# Patient Record
Sex: Female | Born: 1995 | Race: Black or African American | Hispanic: No | Marital: Single | Smoking: Never smoker
Health system: Southern US, Community
[De-identification: ages and names within clinical notes are randomized; demographics above are authoritative.]

## PROBLEM LIST (undated history)

## (undated) DIAGNOSIS — Q825 Congenital non-neoplastic nevus: Secondary | ICD-10-CM

## (undated) DIAGNOSIS — J45909 Unspecified asthma, uncomplicated: Secondary | ICD-10-CM

## (undated) DIAGNOSIS — D649 Anemia, unspecified: Secondary | ICD-10-CM

## (undated) DIAGNOSIS — Q828 Other specified congenital malformations of skin: Secondary | ICD-10-CM

## (undated) DIAGNOSIS — T7840XA Allergy, unspecified, initial encounter: Secondary | ICD-10-CM

## (undated) DIAGNOSIS — L709 Acne, unspecified: Secondary | ICD-10-CM

## (undated) HISTORY — DX: Unspecified asthma, uncomplicated: J45.909

## (undated) HISTORY — DX: Congenital non-neoplastic nevus: Q82.5

## (undated) HISTORY — DX: Anemia, unspecified: D64.9

## (undated) HISTORY — DX: Acne, unspecified: L70.9

## (undated) HISTORY — PX: TYMPANOSTOMY TUBE PLACEMENT: SHX32

## (undated) HISTORY — DX: Other specified congenital malformations of skin: Q82.8

## (undated) HISTORY — DX: Allergy, unspecified, initial encounter: T78.40XA

---
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
## 2015-04-23 ENCOUNTER — Encounter: Payer: Self-pay | Admitting: *Deleted

## 2015-04-23 ENCOUNTER — Inpatient Hospital Stay
Admission: EM | Admit: 2015-04-23 | Discharge: 2015-04-25 | DRG: 872 | Disposition: A | Discharge status: Home / Self Care | Payer: Managed Care, Other (non HMO) | Attending: Internal Medicine | Admitting: Internal Medicine

## 2015-04-23 DIAGNOSIS — E871 Hypo-osmolality and hyponatremia: Secondary | ICD-10-CM | POA: Diagnosis present

## 2015-04-23 DIAGNOSIS — Z79891 Long term (current) use of opiate analgesic: Secondary | ICD-10-CM

## 2015-04-23 DIAGNOSIS — A4151 Sepsis due to Escherichia coli [E. coli]: Principal | ICD-10-CM | POA: Diagnosis present

## 2015-04-23 DIAGNOSIS — N10 Acute tubulo-interstitial nephritis: Secondary | ICD-10-CM | POA: Diagnosis present

## 2015-04-23 DIAGNOSIS — R51 Headache: Secondary | ICD-10-CM | POA: Diagnosis present

## 2015-04-23 DIAGNOSIS — N12 Tubulo-interstitial nephritis, not specified as acute or chronic: Secondary | ICD-10-CM | POA: Diagnosis present

## 2015-04-23 DIAGNOSIS — Z880 Allergy status to penicillin: Secondary | ICD-10-CM | POA: Diagnosis not present

## 2015-04-23 DIAGNOSIS — J45909 Unspecified asthma, uncomplicated: Secondary | ICD-10-CM | POA: Diagnosis present

## 2015-04-23 DIAGNOSIS — Z79899 Other long term (current) drug therapy: Secondary | ICD-10-CM

## 2015-04-23 DIAGNOSIS — A419 Sepsis, unspecified organism: Secondary | ICD-10-CM | POA: Diagnosis present

## 2015-04-23 DIAGNOSIS — A770 Spotted fever due to Rickettsia rickettsii: Secondary | ICD-10-CM | POA: Diagnosis present

## 2015-04-23 LAB — CBC WITH DIFFERENTIAL/PLATELET
Band Neutrophils: 0 %
Basophils Absolute: 0 10*3/uL (ref 0–0.1)
Basophils Relative: 0 %
Blasts: 0 %
Eosinophils Absolute: 0 10*3/uL (ref 0–0.7)
Eosinophils Relative: 0 %
HCT: 40.7 % (ref 35.0–47.0)
Hemoglobin: 13.3 g/dL (ref 12.0–16.0)
Lymphocytes Relative: 13 %
Lymphs Abs: 2.3 10*3/uL (ref 1.0–3.6)
MCH: 27.4 pg (ref 26.0–34.0)
MCHC: 32.7 g/dL (ref 32.0–36.0)
MCV: 83.9 fL (ref 80.0–100.0)
Metamyelocytes Relative: 0 %
Monocytes Absolute: 1.2 10*3/uL — ABNORMAL HIGH (ref 0.2–0.9)
Monocytes Relative: 7 %
Myelocytes: 0 %
Neutro Abs: 14.2 10*3/uL — ABNORMAL HIGH (ref 1.4–6.5)
Neutrophils Relative %: 80 %
Other: 0 %
Platelets: 257 10*3/uL (ref 150–440)
Promyelocytes Absolute: 0 %
RBC: 4.84 MIL/uL (ref 3.80–5.20)
RDW: 15.2 % — ABNORMAL HIGH (ref 11.5–14.5)
WBC: 17.7 10*3/uL — ABNORMAL HIGH (ref 3.6–11.0)
nRBC: 0 /100 WBC

## 2015-04-23 LAB — COMPREHENSIVE METABOLIC PANEL
ALT: 17 U/L (ref 14–54)
AST: 22 U/L (ref 15–41)
Albumin: 4.2 g/dL (ref 3.5–5.0)
Alkaline Phosphatase: 46 U/L (ref 38–126)
Anion gap: 8 (ref 5–15)
BUN: 7 mg/dL (ref 6–20)
CO2: 24 mmol/L (ref 22–32)
Calcium: 9.4 mg/dL (ref 8.9–10.3)
Chloride: 102 mmol/L (ref 101–111)
Creatinine, Ser: 0.84 mg/dL (ref 0.44–1.00)
GFR calc Af Amer: >60 mL/min (ref >60)
GFR calc non Af Amer: >60 mL/min (ref >60)
Glucose, Bld: 104 mg/dL — ABNORMAL HIGH (ref 65–99)
Potassium: 3.5 mmol/L (ref 3.5–5.1)
Sodium: 134 mmol/L — ABNORMAL LOW (ref 135–145)
Total Bilirubin: 1 mg/dL (ref 0.3–1.2)
Total Protein: 8.5 g/dL — ABNORMAL HIGH (ref 6.5–8.1)

## 2015-04-23 LAB — PROTIME-INR
INR: 1.15
PROTHROMBIN TIME: 14.9 s (ref 11.4–15.0)

## 2015-04-23 LAB — URINALYSIS COMPLETE WITH MICROSCOPIC (ARMC ONLY)
Bilirubin Urine: NEGATIVE
Glucose, UA: NEGATIVE mg/dL
Ketones, ur: NEGATIVE mg/dL
Nitrite: NEGATIVE
Protein, ur: 30 mg/dL — AB
Specific Gravity, Urine: 1.015 (ref 1.005–1.030)
pH: 5 (ref 5.0–8.0)

## 2015-04-23 LAB — PREGNANCY, URINE: Preg Test, Ur: NEGATIVE

## 2015-04-23 MED ORDER — SODIUM CHLORIDE 0.9 % IV SOLN
INTRAVENOUS | Status: DC
Start: 2015-04-23 — End: 2015-04-25
  Administered 2015-04-24 – 2015-04-25 (×4): via INTRAVENOUS

## 2015-04-23 MED ORDER — ONDANSETRON HCL 4 MG PO TABS: 4.0000 mg | ORAL_TABLET | Freq: Four times a day (QID) | ORAL | Status: DC | PRN

## 2015-04-23 MED ORDER — ONDANSETRON HCL 4 MG/2ML IJ SOLN: 4.0000 mg | Freq: Four times a day (QID) | INTRAMUSCULAR | Status: DC | PRN

## 2015-04-23 MED ORDER — CIPROFLOXACIN IN D5W 400 MG/200ML IV SOLN
400.0000 mg | Freq: Two times a day (BID) | INTRAVENOUS | Status: DC
  Administered 2015-04-24: 400 mg via INTRAVENOUS
  Filled 2015-04-23 (×5): qty 200

## 2015-04-23 MED ORDER — CEFTRIAXONE SODIUM 2 G IJ SOLR
2.0000 g | Freq: Once | INTRAMUSCULAR | Status: AC
  Administered 2015-04-24: 2 g via INTRAVENOUS
  Filled 2015-04-23 (×2): qty 2

## 2015-04-23 MED ORDER — IBUPROFEN 600 MG PO TABS
600.0000 mg | ORAL_TABLET | ORAL | Status: AC
Start: 2015-04-23 — End: 2015-04-23
  Administered 2015-04-23: 600 mg via ORAL
  Filled 2015-04-23: qty 1

## 2015-04-23 MED ORDER — ACETAMINOPHEN 325 MG PO TABS
650.0000 mg | ORAL_TABLET | Freq: Four times a day (QID) | ORAL | Status: DC | PRN
  Administered 2015-04-23 – 2015-04-24 (×2): 650 mg via ORAL
  Filled 2015-04-23 (×2): qty 2

## 2015-04-23 MED ORDER — ACETAMINOPHEN 500 MG PO TABS
1000.0000 mg | ORAL_TABLET | ORAL | Status: AC
  Administered 2015-04-23: 1000 mg via ORAL
  Filled 2015-04-23: qty 2

## 2015-04-23 MED ORDER — HEPARIN SODIUM (PORCINE) 5000 UNIT/ML IJ SOLN
5000.0000 [IU] | Freq: Three times a day (TID) | INTRAMUSCULAR | Status: DC
  Administered 2015-04-23 – 2015-04-24 (×2): 5000 [IU] via SUBCUTANEOUS
  Filled 2015-04-23 (×2): qty 1

## 2015-04-23 MED ORDER — ALBUTEROL SULFATE (2.5 MG/3ML) 0.083% IN NEBU: 2.5000 mg | INHALATION_SOLUTION | RESPIRATORY_TRACT | Status: DC | PRN

## 2015-04-23 MED ORDER — ACETAMINOPHEN 650 MG RE SUPP: 650.0000 mg | Freq: Four times a day (QID) | RECTAL | Status: DC | PRN

## 2015-04-23 MED ORDER — SODIUM CHLORIDE 0.9 % IV BOLUS (SEPSIS)
1000.0000 mL | Freq: Once | INTRAVENOUS | Status: AC
  Administered 2015-04-23: 1000 mL via INTRAVENOUS

## 2015-04-23 NOTE — ED Provider Notes (Signed)
[facility] Emergency Department Provider Note REMINDER - THIS NOTE IS NOT A FINAL MEDICAL RECORD UNTIL IT IS SIGNED. UNTIL THEN, THE CONTENT BELOW MAY REFLECT INFORMATION FROM A DOCUMENTATION TEMPLATE, NOT THE ACTUAL PATIENT VISIT. ____________________________________________  Time seen: Approximately 6:30 PM  I have reviewed the triage vital signs and the nursing notes.   HISTORY  Chief Complaint Headache    HPI Laura Fleming is a 19 y.o. female conservative evaluation of chills and headaches. Patient reports that she's been having intermittent symptoms of a pressure headache over the frontal scalp off-and-on since March and it comes and goes about once a month and last for a few days. Usually makes her feel achy around her back, neck, and makes light hurt her eyes. She does report she has chills, but does not know of any fevers. She denies any travel or tick bites. She denies any other signs or symptoms of illness. She denies pregnancy.  She does report she has not been eating very well today because whenever she eats it seems to make her symptoms of nausea slightly worse.  She does have a history of asthma She is now be her inhaler which she is occasionally but is not having any symptoms today. History reviewed. No pertinent past medical history.  Patient Active Problem List   Diagnosis Date Noted  . Pyelonephritis 04/23/2015  . Sepsis 04/23/2015    History reviewed. No pertinent past surgical history.  Current Outpatient Rx  Name  Route  Sig  Dispense  Refill  . albuterol (PROVENTIL HFA;VENTOLIN HFA) 108 (90 BASE) MCG/ACT inhaler   Inhalation   Inhale 2 puffs into the lungs every 6 (six) hours as needed for wheezing or shortness of breath.         Marland Kitchen ibuprofen (ADVIL,MOTRIN) 200 MG tablet   Oral   Take 400 mg by mouth every 6 (six) hours as needed for headache.           Allergies Penicillins  History reviewed. No pertinent family  history.  Social History Social History  Substance Use Topics  . Smoking status: Never Smoker   . Smokeless tobacco: None  . Alcohol Use: No    Review of Systems Constitutional: Chills  Eyes: No visual changes. Light does make the headache slightly worse. ENT: No sore throat. Cardiovascular: Denies chest pain. Respiratory: Denies shortness of breath. Gastrointestinal: No abdominal pain.  No nausea, no vomiting.  No diarrhea.  No constipation. Does report having an achy pain in her right lower back for the last few days as well. Genitourinary: Negative for dysuria. Musculoskeletal: Muscle aches. Skin: Negative for rash. No tick or insect bite. Neurological: Negative for headaches, focal weakness or numbness.  She denies outdoor exposure camping or other risk factors for being bit by tick.  10-point ROS otherwise negative.  ____________________________________________   PHYSICAL EXAM:  VITAL SIGNS: ED Triage Vitals  Enc Vitals Group     BP 04/23/15 1755 132/76 mmHg     Pulse Rate 04/23/15 1755 131     Resp 04/23/15 1755 18     Temp 04/23/15 1755 100.2 F (37.9 C)     Temp Source 04/23/15 1755 Oral     SpO2 04/23/15 1755 100 %     Weight 04/23/15 1755 153 lb (69.4 kg)     Height 04/23/15 1755  (1.651 m)     Head Cir --      Peak Flow --      Pain  Score 04/23/15 1758 8     Pain Loc --      Pain Edu? --      Excl. in GC? --    Constitutional: Alert and oriented. Well appearing and in no acute distress. Eyes: Conjunctivae are normal. PERRL. EOMI. Head: Atraumatic. Nose: No congestion/rhinnorhea. Mouth/Throat: Mucous membranes are moist.  Oropharynx non-erythematous. Neck: No stridor.  The patient has no nuchal rigidity. She does report that it is somewhat achy when she turns her head left and right in the area of her neck. Cardiovascular: Normal rate, regular rhythm. Grossly normal heart sounds.  Good peripheral circulation. Respiratory: Normal respiratory  effort.  No retractions. Lungs CTAB. Gastrointestinal: Soft and nontender. No distention. No abdominal bruits. Patient exhibits some mild CVA tenderness on the right. Musculoskeletal: No lower extremity tenderness nor edema.  No joint effusions. Neurologic:  Normal speech and language. No gross focal neurologic deficits are appreciated. No gait instability. Skin:  Skin is warm, dry and intact. No rash noted. Psychiatric: Mood and affect are normal. Speech and behavior are normal.  ____________________________________________   LABS (all labs ordered are listed, but only abnormal results are displayed)  Labs Reviewed  CBC WITH DIFFERENTIAL/PLATELET - Abnormal; Notable for the following:    WBC 17.7 (*)    RDW 15.2 (*)    All other components within normal limits  COMPREHENSIVE METABOLIC PANEL - Abnormal; Notable for the following:    Sodium 134 (*)    Glucose, Bld 104 (*)    Total Protein 8.5 (*)    All other components within normal limits  URINALYSIS COMPLETEWITH MICROSCOPIC ([facility] ONLY) - Abnormal; Notable for the following:    Color, Urine YELLOW (*)    APPearance HAZY (*)    Hgb urine dipstick 2+ (*)    Protein, ur 30 (*)    Leukocytes, UA 1+ (*)    Bacteria, UA MANY (*)    Squamous Epithelial / LPF 0-5 (*)    All other components within normal limits  PREGNANCY, URINE  PROTIME-INR  ROCKY MTN SPOTTED FVR ABS PNL(IGG+IGM)   ____________________________________________  EKG   ____________________________________________  RADIOLOGY   ____________________________________________   PROCEDURES  Procedure(s) performed: None  Critical Care performed: No  ____________________________________________   INITIAL IMPRESSION / ASSESSMENT AND PLAN / ED COURSE  Pertinent labs & imaging results that were available during my care of the patient were reviewed by me and considered in my medical decision making (see chart for details).  Patient presents with mild headache  feeling of chills and some slight photosensitivity and discomfort along with myalgias since Monday. It seems unlikely at this point that the patient would have acute meningitis based on her minimum symptomatology, but I wonder if she may have a mild other viral-type illness or possibly urinary tract infection based on reported right-sided CVA tenderness. She is notably tachycardic and does have a slight low-grade temperature. I discussed with the patient, I feel that if we evaluated with labs and urinalysis and no other infection is obvious as a cause, then we would consider performing lumbar puncture to rule out meningitis. She has no focal neurologic symptoms, no indication for CT of the head.  ----------------------------------------- 7:55 PM on 04/23/2015 -----------------------------------------  Patient reports some improvement in her symptoms with hydration, she is still slightly tachycardic. She has obvious evidence of urinary tract infection which would be consistent with right-sided pyelonephritis based on examination and urinalysis. Patient is presently meeting sepsis criteria. We will add blood cultures and initiate  Rocephin, the patient reports she had a penicillin allergy as a child but is able to tolerate medications including Augmentin in the past which is seemingly makes true penicillin allergy and ceftriaxone cross-reactivity extremely unlikely.  The patient agreeable with admission, antibiotic's and discussed with Dr. Imogene Burn the hospitalist. ____________________________________________   FINAL CLINICAL IMPRESSION(S) / ED DIAGNOSES  Final diagnoses:  Pyelonephritis  Sepsis, due to unspecified organism      Sharyn Creamer, MD 04/23/15 2000

## 2015-04-23 NOTE — ED Notes (Addendum)
Patient states that since March or May she has had intermittent headaches with chills. Patient states symptoms started again yesterday morning when she went to work. Patient c/o photosensitivity.

## 2015-04-23 NOTE — H&P (Signed)
[facility] [place] at [facility]   PATIENT NAME: Laura Fleming    MR#:  [id number]  DATE OF BIRTH:  [birth year]  DATE OF ADMISSION:  04/23/2015  PRIMARY CARE PHYSICIAN: Ruel Favors, MD   REQUESTING/REFERRING PHYSICIAN: Sharyn Creamer, MD  CHIEF COMPLAINT:   Chief Complaint  Patient presents with  . Headache   fever, chills and headache today.  HISTORY OF PRESENT ILLNESS:  Laura Fleming  is a 19 y.o. female with no past medical history. The patient presents to the ED with fever, chills and headache today. She denies any cough, sputum or shortness of breath. She denies any dysuria, hematuria or incontinence or urinary frequency. She complained of right flank pain. Urinalysis showed too numerous WBC.  PAST MEDICAL HISTORY:  History reviewed. No pertinent past medical history. no past medical history.  PAST SURGICAL HISTORY:  History reviewed. No pertinent past surgical history. no surgical history.  SOCIAL HISTORY:   Social History  Substance Use Topics  . Smoking status: Never Smoker   . Smokeless tobacco: Not on file  . Alcohol Use: No   Sexually active with protection, no history of sexually transmitted disease. FAMILY HISTORY:  History reviewed. No pertinent family history.No family history. Both parents are healthy.  DRUG ALLERGIES:   Allergies  Allergen Reactions  . Penicillins Other (See Comments)    Reaction:  Unknown; childhood reaction     REVIEW OF SYSTEMS:  CONSTITUTIONAL:Has  Fever,chill and headache.but has generalized weakness. No appetite.  EYES: No blurred or double vision.  EARS, NOSE, AND THROAT: No tinnitus or ear pain.  RESPIRATORY: No cough, shortness of breath, wheezing or hemoptysis.  CARDIOVASCULAR: No chest pain, orthopnea, edema.  GASTROINTESTINAL: No nausea, vomiting, diarrhea or abdominal pain. But has a right-sided flank pain.  GENITOURINARY: No dysuria, hematuria. No urine frequency.  ENDOCRINE: No  polyuria, nocturia,  HEMATOLOGY: No anemia, easy bruising or bleeding SKIN: No rash or lesion. MUSCULOSKELETAL: No joint pain or arthritis.   NEUROLOGIC: No tingling, numbness, weakness.  PSYCHIATRY: No anxiety or depression.   MEDICATIONS AT HOME:   Prior to Admission medications   Medication Sig Start Date End Date Taking? Authorizing Provider  albuterol (PROVENTIL HFA;VENTOLIN HFA) 108 (90 BASE) MCG/ACT inhaler Inhale 2 puffs into the lungs every 6 (six) hours as needed for wheezing or shortness of breath.   Yes Historical Provider, MD  ibuprofen (ADVIL,MOTRIN) 200 MG tablet Take 400 mg by mouth every 6 (six) hours as needed for headache.   Yes Historical Provider, MD      VITAL SIGNS:  Blood pressure 132/86, pulse 114, temperature 100.2 F (37.9 C), temperature source Oral, resp. rate 16, height  (1.651 m), weight 69.4 kg (153 lb), SpO2 99 %.  PHYSICAL EXAMINATION:  GENERAL:  19 y.o.-year-old patient lying in the bed with no acute distress.  EYES: Pupils equal, round, reactive to light and accommodation. No scleral icterus. Extraocular muscles intact.  HEENT: Head atraumatic, normocephalic. Oropharynx and nasopharynx clear. Moist oral mucosa.  NECK:  Supple, no jugular venous distention. No thyroid enlargement, no tenderness.  LUNGS: Normal breath sounds bilaterally, no wheezing, rales,rhonchi or crepitation. No use of accessory muscles of respiration.  CARDIOVASCULAR: S1, S2 normal. No murmurs, rubs, or gallops.  ABDOMEN: Soft, nontender, nondistended. Bowel sounds present. No organomegaly or mass. CVA tenderness on the right side.  EXTREMITIES: No pedal edema, cyanosis, or clubbing.  NEUROLOGIC: Cranial nerves II through XII are intact. Muscle strength 5/5 in all extremities. Sensation  intact. Gait not checked.  PSYCHIATRIC: The patient is alert and oriented x 3.  SKIN: No obvious rash, lesion, or ulcer.   LABORATORY PANEL:   CBC  Recent Labs Lab 04/23/15 1818  WBC  17.7*  HGB 13.3  HCT 40.7  PLT 257   ------------------------------------------------------------------------------------------------------------------  Chemistries   Recent Labs Lab 04/23/15 1818  NA 134*  K 3.5  CL 102  CO2 24  GLUCOSE 104*  BUN 7  CREATININE 0.84  CALCIUM 9.4  AST 22  ALT 17  ALKPHOS 46  BILITOT 1.0   ------------------------------------------------------------------------------------------------------------------  Cardiac Enzymes No results for input(s): TROPONINI in the last 168 hours. ------------------------------------------------------------------------------------------------------------------  RADIOLOGY:  No results found.  EKG:  No orders found for this or any previous visit.  IMPRESSION AND PLAN:   Acute pyelonephritis Sepsis with fever, leukocytosis and tachycardia Hyponatremia  The patient will be admitted to medical floor. I will start Cipro IV twice a day, follow-up CBC, blood culture and urine culture. I will start normal saline IV and follow-up BMP. Negative pregnant test.   All the records are reviewed and case discussed with ED provider. Management plans discussed with the patient, family and they are in agreement.  CODE STATUS: Full code  TOTAL TIME TAKING CARE OF THIS PATIENT: 46 minutes.    Shaune Pollack M.D on 04/23/2015 at 8:12 PM  Between 7am to 6pm - Pager - [phone]  After 6pm go to [email or web address] - password [email or web address] [facility]  [employer] [place] [facility]  Office  [phone]  CC: Primary care physician; Ruel Favors, MD

## 2015-04-23 NOTE — ED Notes (Signed)
MD at bedside. 

## 2015-04-24 LAB — CULTURE, BLOOD (ROUTINE X 2): Special Requests: NORMAL

## 2015-04-24 LAB — CBC
HCT: 34.6 % — ABNORMAL LOW (ref 35.0–47.0)
Hemoglobin: 11.6 g/dL — ABNORMAL LOW (ref 12.0–16.0)
MCH: 28.1 pg (ref 26.0–34.0)
MCHC: 33.5 g/dL (ref 32.0–36.0)
MCV: 83.9 fL (ref 80.0–100.0)
PLATELETS: 200 10*3/uL (ref 150–440)
RBC: 4.12 MIL/uL (ref 3.80–5.20)
RDW: 15 % — AB (ref 11.5–14.5)
WBC: 9.3 10*3/uL (ref 3.6–11.0)

## 2015-04-24 LAB — BASIC METABOLIC PANEL
Anion gap: 4 — ABNORMAL LOW (ref 5–15)
BUN: 9 mg/dL (ref 6–20)
CALCIUM: 8 mg/dL — AB (ref 8.9–10.3)
CO2: 25 mmol/L (ref 22–32)
CREATININE: 0.77 mg/dL (ref 0.44–1.00)
Chloride: 109 mmol/L (ref 101–111)
GFR calc Af Amer: >60 mL/min (ref >60)
GLUCOSE: 102 mg/dL — AB (ref 65–99)
Potassium: 3.7 mmol/L (ref 3.5–5.1)
Sodium: 138 mmol/L (ref 135–145)

## 2015-04-24 LAB — ROCKY MTN SPOTTED FVR ABS PNL(IGG+IGM)
RMSF IGG: NEGATIVE
RMSF IGM: 2.47 {index} — AB (ref 0.00–0.89)

## 2015-04-24 MED ORDER — DOXYCYCLINE HYCLATE 100 MG PO TABS
100.0000 mg | ORAL_TABLET | Freq: Two times a day (BID) | ORAL | Status: DC
  Administered 2015-04-24 – 2015-04-25 (×2): 100 mg via ORAL
  Filled 2015-04-24 (×3): qty 1

## 2015-04-24 MED ORDER — OXYCODONE HCL 5 MG PO TABS
5.0000 mg | ORAL_TABLET | ORAL | Status: DC | PRN
  Administered 2015-04-25: 5 mg via ORAL
  Filled 2015-04-24: qty 1

## 2015-04-24 MED ORDER — DEXTROSE 5 % IV SOLN
1.0000 g | INTRAVENOUS | Status: DC
  Administered 2015-04-24: 13:00:00 1 g via INTRAVENOUS
  Filled 2015-04-24 (×3): qty 10

## 2015-04-24 MED ORDER — MORPHINE SULFATE (PF) 2 MG/ML IV SOLN: 2.0000 mg | INTRAVENOUS | Status: DC | PRN

## 2015-04-24 NOTE — Plan of Care (Signed)
Problem: Discharge Progression Outcomes Goal: Discharge plan in place and appropriate Individualization of care  Outcome: Progressing Individualization Likes to be called Verl. Lives at home. Has history of asthma, controlled by medication.    Goal: Other Discharge Outcomes/Goals Outcome: Progressing Progress toward goals: Pain:  Pt c/o burning pain when urinating. Pain decreases to acceptable post void. Hemodynamically stable. No complications noted. Tolerating diet Independent in ambulation. Discharge tomorrow if no changes overnight.

## 2015-04-24 NOTE — Progress Notes (Signed)
[facility] - [place] at [facility]   PATIENT NAME: Laura Fleming    MR#:  [id number]  DATE OF BIRTH:  [birth year]  SUBJECTIVE: Patient admitted for pyelonephritis. She states she has some mom left flank pain and dysuria.   CHIEF COMPLAINT:   Chief Complaint  Patient presents with  . Headache    REVIEW OF SYSTEMS:   ROS CONSTITUTIONAL: No fever, fatigue or weakness.  EYES: No blurred or double vision.  EARS, NOSE, AND THROAT: No tinnitus or ear pain.  RESPIRATORY: No cough, shortness of breath, wheezing or hemoptysis.  CARDIOVASCULAR: No chest pain, orthopnea, edema.  GASTROINTESTINAL: No nausea, vomiting, diarrhea or abdominal pain.  GENITOURINARY: No dysuria, hematuria.  ENDOCRINE: No polyuria, nocturia,  HEMATOLOGY: No anemia, easy bruising or bleeding SKIN: No rash or lesion. MUSCULOSKELETAL: No joint pain or arthritis.   NEUROLOGIC: No tingling, numbness, weakness.  PSYCHIATRY: No anxiety or depression.   DRUG ALLERGIES:   Allergies  Allergen Reactions  . Penicillins Other (See Comments)    Reaction:  Unknown; childhood reaction     VITALS:  Blood pressure 113/66, pulse 70, temperature 97.6 F (36.4 C), temperature source Oral, resp. rate 22, height  (1.651 m), weight 69.673 kg (153 lb 9.6 oz), SpO2 100 %.  PHYSICAL EXAMINATION:  GENERAL:  19 y.o.-year-old patient lying in the bed with no acute distress.  EYES: Pupils equal, round, reactive to light and accommodation. No scleral icterus. Extraocular muscles intact.  HEENT: Head atraumatic, normocephalic. Oropharynx and nasopharynx clear.  NECK:  Supple, no jugular venous distention. No thyroid enlargement, no tenderness.  LUNGS: Normal breath sounds bilaterally, no wheezing, rales,rhonchi or crepitation. No use of accessory muscles of respiration.  CARDIOVASCULAR: S1, S2 normal. No murmurs, rubs, or gallops.  ABDOMEN: Soft, nontender, nondistended. Bowel sounds present. No  organomegaly or mass.  EXTREMITIES: No pedal edema, cyanosis, or clubbing.  NEUROLOGIC: Cranial nerves II through XII are intact. Muscle strength 5/5 in all extremities. Sensation intact. Gait not checked.  PSYCHIATRIC: The patient is alert and oriented x 3.  SKIN: No obvious rash, lesion, or ulcer.    LABORATORY PANEL:   CBC  Recent Labs Lab 04/24/15 0546  WBC 9.3  HGB 11.6*  HCT 34.6*  PLT 200   ------------------------------------------------------------------------------------------------------------------  Chemistries   Recent Labs Lab 04/23/15 1818 04/24/15 0546  NA 134* 138  K 3.5 3.7  CL 102 109  CO2 24 25  GLUCOSE 104* 102*  BUN 7 9  CREATININE 0.84 0.77  CALCIUM 9.4 8.0*  AST 22  --   ALT 17  --   ALKPHOS 46  --   BILITOT 1.0  --    ------------------------------------------------------------------------------------------------------------------  Cardiac Enzymes No results for input(s): TROPONINI in the last 168 hours. ------------------------------------------------------------------------------------------------------------------  RADIOLOGY:  No results found.  EKG:  No orders found for this or any previous visit.  ASSESSMENT AND PLAN:  1. Recurrent nephritis with fever leukocytosis tachycardia and admission: Patient the symptoms improved and the Cipro was changed to Rocephin. Follow urine cultures and likely discharged tomorrow. #2 hyponatremia improved with IV hydration.    All the records are reviewed and case discussed with Care Management/Social Workerr. Management plans discussed with the patient, family and they are in agreement.  CODE STATUS: full  TOTAL TIME TAKING CARE OF THIS PATIENT: 35 minutes.   POSSIBLE D/C IN 1-2 DAYS, DEPENDING ON CLINICAL CONDITION.   Katha Hamming M.D on 04/24/2015 at 11:41 AM  Between 7am to 6pm - Pager -  [phone]  After 6pm go to [email or web address] - password [email or web address] [facility]  [place]  [facility]ospital[facility]  Office  [phone]  CC: Primary care physician; Ruel Favors, MD

## 2015-04-24 NOTE — Plan of Care (Signed)
Problem: Discharge Progression Outcomes Goal: Other Discharge Outcomes/Goals Plan of care progress to goal: - Complained of headache, tylenol given with improvement. - Ambulates in room. - Continues IV fluids. - Continues ABX. Will continue to monitor.

## 2015-04-24 NOTE — Progress Notes (Signed)
Informed by nursing staff patient's [facility] spotted fever IgM antibody returned positive It is possible to have false-positive IgM,  however will initiate doxycycline 100 mg by mouth twice a day for now

## 2015-04-24 NOTE — Plan of Care (Signed)
Problem: Discharge Progression Outcomes Goal: Discharge plan in place and appropriate Individualization of care Likes to be called Carisha. Lives at home. Has history of asthma, controlled by medication.

## 2015-04-24 NOTE — Progress Notes (Signed)
TC RMSF IGM elevated results with pt co's HA and back ache tonight relieved by tylenol; afebrile called to Dr. Clint Guy. Advised he will assess and FU as indicated.

## 2015-04-25 MED ORDER — LEVOFLOXACIN 250 MG PO TABS
250.0000 mg | ORAL_TABLET | Freq: Every day | ORAL | Status: DC
  Administered 2015-04-25: 250 mg via ORAL
  Filled 2015-04-25: qty 1

## 2015-04-25 MED ORDER — LEVOFLOXACIN 250 MG PO TABS: 250.0000 mg | ORAL_TABLET | Freq: Every day | ORAL | Status: DC

## 2015-04-25 MED ORDER — CYCLOBENZAPRINE HCL 10 MG PO TABS
5.0000 mg | ORAL_TABLET | ORAL | Status: AC
  Administered 2015-04-25: 5 mg via ORAL
  Filled 2015-04-25: qty 1

## 2015-04-25 MED ORDER — DOXYCYCLINE HYCLATE 100 MG PO TABS: 100.0000 mg | ORAL_TABLET | Freq: Two times a day (BID) | ORAL | Status: AC

## 2015-04-25 MED ORDER — TRAMADOL-ACETAMINOPHEN 37.5-325 MG PO TABS: 1.0000 | ORAL_TABLET | Freq: Four times a day (QID) | ORAL | Status: DC | PRN

## 2015-04-25 NOTE — Discharge Summary (Addendum)
[facility] - [place] at [facility]   PATIENT NAME: Laura Fleming    MR#:  [id number]  DATE OF BIRTH:  10/18/1995  DATE OF ADMISSION:  04/23/2015 ADMITTING PHYSICIAN: Shaune Pollack, MD  DATE OF DISCHARGE: 04/25/2015  PRIMARY CARE PHYSICIAN: Ruel Favors, MD    ADMISSION DIAGNOSIS:  Pyelonephritis [N12] Sepsis, due to unspecified organism [A41.9]  DISCHARGE DIAGNOSIS:  Principal Problem:   Pyelonephritis Active Problems:   Sepsis   SECONDARY DIAGNOSIS:  History reviewed. No pertinent past medical history.  HOSPITAL COURSE:   19 year old young African-American female with no significant past medical history presents to the hospital secondary to headache and right flank pain.  #1. Acute pyelonephritis-blood cultures are negative so far. Urine cultures positive for gram-negative rods. -Likely Escherichia coli. Sensitivities pending at this time. -Responding to Rocephin. We'll discharge on Levaquin at this time. - urine pregnancy test is negative  #2. Positive RMSF IgM- asymptomatic, no rash, normal labs other than igM positive- could be false positive. Pt did have fever, elevated wbc and headache on admission - discussed with Dr. Sampson Goon and will treat with doxycycline for 5 days  #3. Hyponatremia-resolved with IV fluids  Discharge today  DISCHARGE CONDITIONS:   Stable  CONSULTS OBTAINED:   None  DRUG ALLERGIES:   Allergies  Allergen Reactions  . Penicillins Other (See Comments)    Reaction:  Unknown; childhood reaction     DISCHARGE MEDICATIONS:   Current Discharge Medication List    START taking these medications   Details  doxycycline (VIBRA-TABS) 100 MG tablet Take 1 tablet (100 mg total) by mouth every 12 (twelve) hours. Qty: 9 tablet, Refills: 0    levofloxacin (LEVAQUIN) 250 MG tablet Take 1 tablet (250 mg total) by mouth daily. Qty: 5 tablet, Refills: 0    traMADol-acetaminophen (ULTRACET) 37.5-325 MG per tablet  Take 1 tablet by mouth every 6 (six) hours as needed for moderate pain or severe pain. Qty: 20 tablet, Refills: 0      CONTINUE these medications which have NOT CHANGED   Details  albuterol (PROVENTIL HFA;VENTOLIN HFA) 108 (90 BASE) MCG/ACT inhaler Inhale 2 puffs into the lungs every 6 (six) hours as needed for wheezing or shortness of breath.    ibuprofen (ADVIL,MOTRIN) 200 MG tablet Take 400 mg by mouth every 6 (six) hours as needed for headache.         DISCHARGE INSTRUCTIONS:   1. PCP f/u in 1-2 weeks  If you experience worsening of your admission symptoms, develop shortness of breath, life threatening emergency, suicidal or homicidal thoughts you must seek medical attention immediately by calling 911 or calling your MD immediately  if symptoms less severe.  You Must read complete instructions/literature along with all the possible adverse reactions/side effects for all the Medicines you take and that have been prescribed to you. Take any new Medicines after you have completely understood and accept all the possible adverse reactions/side effects.   Please note  You were cared for by a hospitalist during your hospital stay. If you have any questions about your discharge medications or the care you received while you were in the hospital after you are discharged, you can call the unit and asked to speak with the hospitalist on call if the hospitalist that took care of you is not available. Once you are discharged, your primary care physician will handle any further medical issues. Please note that NO REFILLS for any discharge medications will be authorized once you are discharged,  as it is imperative that you return to your primary care physician (or establish a relationship with a primary care physician if you do not have one) for your aftercare needs so that they can reassess your need for medications and monitor your lab values.    Today   CHIEF COMPLAINT:   Chief Complaint   Patient presents with  . Headache    VITAL SIGNS:  Blood pressure 121/70, pulse 101, temperature 98 F (36.7 C), temperature source Oral, resp. rate 23, height  (1.651 m), weight 69.673 kg (153 lb 9.6 oz), SpO2 100 %.  I/O:   Intake/Output Summary (Last 24 hours) at 04/25/15 1017 Last data filed at 04/25/15 0800  Gross per 24 hour  Intake    240 ml  Output    675 ml  Net   -435 ml    PHYSICAL EXAMINATION:   Physical Exam  GENERAL:  19 y.o.-year-old patient lying in the bed with no acute distress.  EYES: Pupils equal, round, reactive to light and accommodation. No scleral icterus. Extraocular muscles intact.  HEENT: Head atraumatic, normocephalic. Oropharynx and nasopharynx clear.  NECK:  Supple, no jugular venous distention. No thyroid enlargement, no tenderness.  LUNGS: Normal breath sounds bilaterally, no wheezing, rales,rhonchi or crepitation. No use of accessory muscles of respiration.  CARDIOVASCULAR: S1, S2 normal. No murmurs, rubs, or gallops.  ABDOMEN: Soft, non-tender, non-distended. Bowel sounds present. No organomegaly or mass. Right CVA mild tenderness and right flank discomfort on palpation. EXTREMITIES: No pedal edema, cyanosis, or clubbing.  NEUROLOGIC: Cranial nerves II through XII are intact. Muscle strength 5/5 in all extremities. Sensation intact. Gait not checked.  PSYCHIATRIC: The patient is alert and oriented x 3.  SKIN: No obvious rash, lesion, or ulcer.   DATA REVIEW:   CBC  Recent Labs Lab 04/24/15 0546  WBC 9.3  HGB 11.6*  HCT 34.6*  PLT 200    Chemistries   Recent Labs Lab 04/23/15 1818 04/24/15 0546  NA 134* 138  K 3.5 3.7  CL 102 109  CO2 24 25  GLUCOSE 104* 102*  BUN 7 9  CREATININE 0.84 0.77  CALCIUM 9.4 8.0*  AST 22  --   ALT 17  --   ALKPHOS 46  --   BILITOT 1.0  --     Cardiac Enzymes No results for input(s): TROPONINI in the last 168 hours.  Microbiology Results  Results for orders placed or performed  during the hospital encounter of 04/23/15  Urine culture     Status: None (Preliminary result)   Collection Time: 04/23/15  6:18 PM  Result Value Ref Range Status   Specimen Description URINE, CLEAN CATCH  Final   Special Requests NONE  Final   Culture   Final    >=100,000 COLONIES/mL GRAM NEGATIVE RODS IDENTIFICATION AND SUSCEPTIBILITIES TO FOLLOW    Report Status PENDING  Incomplete  Culture, blood (routine x 2)     Status: None   Collection Time: 04/23/15  8:23 PM  Result Value Ref Range Status   Specimen Description Blood  Final   Special Requests Normal  Final   Culture   Final    NURSE COLLECTED/SPECIMEN NOT RECEIVED. PLEASE REORDER TEST WHEN SPECIMEN HAS BEEN COLLECTED.   Report Status 04/24/2015 FINAL  Final  Culture, blood (routine x 2)     Status: None (Preliminary result)   Collection Time: 04/23/15  8:52 PM  Result Value Ref Range Status   Specimen Description BLOOD LEFT ANTECUBITAL  Final   Special Requests BOTTLES DRAWN AEROBIC AND ANAEROBIC  Final   Culture NO GROWTH < 24 HOURS  Final   Report Status PENDING  Incomplete    RADIOLOGY:  No results found.  EKG:  No orders found for this or any previous visit.    Management plans discussed with the patient, family and they are in agreement.  CODE STATUS:     Code Status Orders        Start     Ordered   04/23/15 2248  Full code   Continuous     04/23/15 2247      TOTAL TIME TAKING CARE OF THIS PATIENT: 36 minutes.    Enid Baas M.D on 04/25/2015 at 10:17 AM  Between 7am to 6pm - Pager - [phone]  After 6pm go to [email or web address] - password [email or web address] [facility]  [facility]  Office  [phone]  CC: Primary care physician; Ruel Favors, MD

## 2015-04-25 NOTE — Plan of Care (Signed)
Problem: Discharge Progression Outcomes Goal: Other Discharge Outcomes/Goals Outcome: Progressing - Complained of pain, oxycodone given with improvement. - Continues on ABX, - Will continue to monitor.

## 2015-04-25 NOTE — Progress Notes (Signed)
Patient discharged home per MD order. Prescriptions and work note given to patient. All discharge instructions given and all questions answered.

## 2015-04-25 NOTE — Progress Notes (Signed)
Notified Dr. Clint Guy of pt pain level, orders received.

## 2015-04-25 NOTE — Discharge Instructions (Signed)
Pyelonephritis, Adult °Pyelonephritis is a kidney infection. A kidney infection can happen quickly, or it can last for a long time. °HOME CARE  °· Take your medicine (antibiotics) as told. Finish it even if you start to feel better. °· Keep all doctor visits as told. °· Drink enough fluids to keep your pee (urine) clear or pale yellow. °· Only take medicine as told by your doctor. °GET HELP RIGHT AWAY IF:  °· You have a fever or lasting symptoms for more than 2-3 days. °· You have a fever and your symptoms suddenly get worse. °· You cannot take your medicine or drink fluids as told. °· You have chills and shaking. °· You feel very weak or pass out (faint). °· You do not feel better after 2 days. °MAKE SURE YOU: °· Understand these instructions. °· Will watch your condition. °· Will get help right away if you are not doing well or get worse. °Document Released: 08/26/2004 Document Revised: 01/18/2012 Document Reviewed: 01/06/2011 °ExitCare® Patient Information ©2015 [employer]. This information is not intended to replace advice given to you by your health care provider. Make sure you discuss any questions you have with your health care provider. ° °

## 2015-04-25 NOTE — Progress Notes (Signed)
     Laura Fleming was admitted to the [facility] on 04/23/2015 for an acute medical condition and is being Discharged on  04/25/2015 . She will need another 3  days for recovery and so advised to stay away from work until then. So please excuse her from work for 3 more Days. Should be able to return to work without any restrictions from 04/29/2015.  Call Enid Baas  MD, [facility] at  [phone] with questions.  Enid Baas M.D on 04/25/2015,at 12:08 PM  [facility] [address] [place] [address]

## 2015-04-25 NOTE — Care Management (Signed)
Admitted to this facility with the diagnosis of pyelonephritis. Lives with her parents and siblings. Mother is Marylene Land [phone]). Graduated from [employer] last year. Works at [employer] on [employer]. Takes care of all basic activities of daily living herself, drives. Last seen Dr. Carlynn Purl at [facility] last year. Gets inhalers from [facility] in [place]. Doesn't know what kind of insurance her mother has on her. Attempted to call mother to discuss insurance. No voice mail, not ringing Family will transport. Gwenette Greet RN MSN Care Manager [phone]

## 2015-04-26 LAB — URINE CULTURE: Culture: >=100000

## 2015-04-28 LAB — CULTURE, BLOOD (ROUTINE X 2): CULTURE: NO GROWTH

## 2015-05-08 ENCOUNTER — Ambulatory Visit: Payer: Self-pay | Admitting: Family Medicine

## 2015-05-20 ENCOUNTER — Ambulatory Visit: Payer: Self-pay | Admitting: Family Medicine

## 2015-05-23 ENCOUNTER — Ambulatory Visit: Payer: Self-pay | Admitting: Family Medicine

## 2016-05-03 ENCOUNTER — Emergency Department
Admission: EM | Admit: 2016-05-03 | Discharge: 2016-05-03 | Disposition: A | Discharge status: Home / Self Care | Payer: Managed Care, Other (non HMO) | Attending: Emergency Medicine | Admitting: Emergency Medicine

## 2016-05-03 ENCOUNTER — Encounter: Payer: Self-pay | Admitting: Emergency Medicine

## 2016-05-03 DIAGNOSIS — J4521 Mild intermittent asthma with (acute) exacerbation: Secondary | ICD-10-CM | POA: Insufficient documentation

## 2016-05-03 MED ORDER — ALBUTEROL SULFATE (2.5 MG/3ML) 0.083% IN NEBU
2.5000 mg | INHALATION_SOLUTION | Freq: Once | RESPIRATORY_TRACT | Status: AC
  Administered 2016-05-03: 2.5 mg via RESPIRATORY_TRACT
  Filled 2016-05-03: qty 3

## 2016-05-03 MED ORDER — ALBUTEROL SULFATE HFA 108 (90 BASE) MCG/ACT IN AERS: 2.0000 | INHALATION_SPRAY | RESPIRATORY_TRACT | 0 refills | Status: AC | PRN

## 2016-05-03 MED ORDER — METHYLPREDNISOLONE SODIUM SUCC 125 MG IJ SOLR
125.0000 mg | Freq: Once | INTRAMUSCULAR | Status: AC
  Administered 2016-05-03: 125 mg via INTRAMUSCULAR
  Filled 2016-05-03: qty 2

## 2016-05-03 NOTE — ED Provider Notes (Signed)
[facility] Emergency Department Provider Note  ____________________________________________  Time seen: Approximately 3:54 PM  I have reviewed the triage vital signs and the nursing notes.   HISTORY  Chief Complaint Shortness of Breath    HPI Laura Fleming is a 20 y.o. female presents emergency department complaining of shortness of breath. The patient states that she has a history of asthma but according to her she has never had an asthma exacerbation. Patient states that she has a rescue inhaler but has not used same today. She denies fevers or chills, nasal congestion, sore throat, coughing, audible wheezing, nausea or vomiting. Patient states that she has no chest pain or tightness of her chest. Patient states that she just feels short of breath. She states that it occurred earlier today, she doesn't feel that at this moment. No other medications prior to arrival.   Past Medical History:  Diagnosis Date  . Acne   . Allergy   . Anemia   . Asthma   . Mongolian macula     Patient Active Problem List   Diagnosis Date Noted  . Pyelonephritis 04/23/2015  . Sepsis (HCC) 04/23/2015    Past Surgical History:  Procedure Laterality Date  . TYMPANOSTOMY TUBE PLACEMENT      Prior to Admission medications   Medication Sig Start Date End Date Taking? Authorizing Provider  albuterol (PROVENTIL HFA;VENTOLIN HFA) 108 (90 BASE) MCG/ACT inhaler Inhale 2 puffs into the lungs every 6 (six) hours as needed for wheezing or shortness of breath.    Historical Provider, MD  albuterol (PROVENTIL HFA;VENTOLIN HFA) 108 (90 Base) MCG/ACT inhaler Inhale 2 puffs into the lungs every 4 (four) hours as needed for wheezing or shortness of breath. 05/03/16   Delorise RoyalsJonathan D Maritta Kief, PA-C  ibuprofen (ADVIL,MOTRIN) 200 MG tablet Take 400 mg by mouth every 6 (six) hours as needed for headache.    Historical Provider, MD    Allergies Penicillins  Family History  Problem Relation  Age of Onset  . Asthma Sister     Social History Social History  Substance Use Topics  . Smoking status: Never Smoker  . Smokeless tobacco: Not on file  . Alcohol use No     Review of Systems  Constitutional: No fever/chills Eyes: No visual changes. No discharge ENT: No upper respiratory complaints. Cardiovascular: no chest pain. Respiratory: no cough. Positive shortness of breath. No audible wheezing. Gastrointestinal: No abdominal pain.  No nausea, no vomiting.  No diarrhea.  No constipation. Musculoskeletal: Negative for musculoskeletal pain. Skin: Negative for rash, abrasions, lacerations, ecchymosis. Neurological: Negative for headaches, focal weakness or numbness. 10-point ROS otherwise negative.  ____________________________________________   PHYSICAL EXAM:  VITAL SIGNS: ED Triage Vitals [05/03/16 1518]  Enc Vitals Group     BP (!) 155/82     Pulse Rate 87     Resp 16     Temp 98.5 F (36.9 C)     Temp Source Oral     SpO2 100 %     Weight      Height      Head Circumference      Peak Flow      Pain Score 7     Pain Loc      Pain Edu?      Excl. in GC?      Constitutional: Alert and oriented. Well appearing and in no acute distress. Eyes: Conjunctivae are normal. PERRL. EOMI. Head: Atraumatic. ENT:      Ears: EACs and  TMs unremarkable bilaterally      Nose: No congestion/rhinnorhea.      Mouth/Throat: Mucous membranes are moist.  Neck: No stridor.   Hematological/Lymphatic/Immunilogical: No cervical lymphadenopathy. Cardiovascular: Normal rate, regular rhythm. Normal S1 and S2.  Good peripheral circulation. Respiratory: Normal respiratory effort without tachypnea or retractions. Lungs with a few scattered expiratory wheezes.Peri Jefferson air entry to the bases with no decreased or absent breath sounds. Musculoskeletal: Full range of motion to all extremities. No gross deformities appreciated. Neurologic:  Normal speech and language. No gross focal  neurologic deficits are appreciated.  Skin:  Skin is warm, dry and intact. No rash noted. Psychiatric: Mood and affect are normal. Speech and behavior are normal. Patient exhibits appropriate insight and judgement.   ____________________________________________   LABS (all labs ordered are listed, but only abnormal results are displayed)  Labs Reviewed - No data to display ____________________________________________  EKG   ____________________________________________  RADIOLOGY   No results found.  ____________________________________________    PROCEDURES  Procedure(s) performed:    Procedures    Medications  methylPREDNISolone sodium succinate (SOLU-MEDROL) 125 mg/2 mL injection 125 mg (not administered)  albuterol (PROVENTIL) (2.5 MG/3ML) 0.083% nebulizer solution 2.5 mg (not administered)     ____________________________________________   INITIAL IMPRESSION / ASSESSMENT AND PLAN / ED COURSE  Pertinent labs & imaging results that were available during my care of the patient were reviewed by me and considered in my medical decision making (see chart for details).  Review of the [place] CSRS was performed in accordance of the [facility] prior to dispensing any controlled drugs.  Clinical Course    Patient's diagnosis is consistent with Asthma exacerbation. Patient has very mild symptoms and exam is reassuring with only scattered few expiratory wheezes. Patient is given albuterol treatment and steroids in the emergency department. Patient reports that this has a good improvement of symptoms.. Patient is encouraged to use her rescue inhaler every 4-6 hours of the next several days. No indication at this time for at home steroid treatment. Patient will follow-up with primary care as needed. Patient is given ED precautions to return to the ED for any worsening or new symptoms.     ____________________________________________  FINAL CLINICAL IMPRESSION(S) / ED  DIAGNOSES  Final diagnoses:  Mild intermittent asthma with exacerbation      NEW MEDICATIONS STARTED DURING THIS VISIT:  New Prescriptions   ALBUTEROL (PROVENTIL HFA;VENTOLIN HFA) 108 (90 BASE) MCG/ACT INHALER    Inhale 2 puffs into the lungs every 4 (four) hours as needed for wheezing or shortness of breath.        This chart was dictated using voice recognition software/Dragon. Despite best efforts to proofread, errors can occur which can change the meaning. Any change was purely unintentional.    Racheal Patches, PA-C 05/03/16 1608    Rockne Menghini, MD 05/03/16 1759

## 2016-05-03 NOTE — ED Notes (Signed)
States she woke up this am with some SOB  And having some discomfort to upper chest and between shoulder blades with inspiration.   No fever or cough

## 2016-05-03 NOTE — ED Triage Notes (Signed)
States has felt Short of Breath today. States history of asthma. Good air movement with slight expiratory wheeze. Denies cough. Denies fevers. Speaking full sentences with Resp unlabored. No leg edema. SAT 100% on room air.

## 2017-01-09 ENCOUNTER — Encounter: Payer: Self-pay | Admitting: Emergency Medicine

## 2017-01-09 ENCOUNTER — Emergency Department
Admission: EM | Admit: 2017-01-09 | Discharge: 2017-01-09 | Disposition: A | Discharge status: Home / Self Care | Payer: Managed Care, Other (non HMO) | Attending: Emergency Medicine | Admitting: Emergency Medicine

## 2017-01-09 ENCOUNTER — Emergency Department: Payer: Managed Care, Other (non HMO)

## 2017-01-09 DIAGNOSIS — W228XXA Striking against or struck by other objects, initial encounter: Secondary | ICD-10-CM | POA: Insufficient documentation

## 2017-01-09 DIAGNOSIS — J45909 Unspecified asthma, uncomplicated: Secondary | ICD-10-CM | POA: Insufficient documentation

## 2017-01-09 DIAGNOSIS — M542 Cervicalgia: Secondary | ICD-10-CM | POA: Insufficient documentation

## 2017-01-09 DIAGNOSIS — Y9389 Activity, other specified: Secondary | ICD-10-CM | POA: Insufficient documentation

## 2017-01-09 DIAGNOSIS — Y9259 Other trade areas as the place of occurrence of the external cause: Secondary | ICD-10-CM | POA: Insufficient documentation

## 2017-01-09 DIAGNOSIS — S0990XA Unspecified injury of head, initial encounter: Secondary | ICD-10-CM | POA: Insufficient documentation

## 2017-01-09 DIAGNOSIS — R0602 Shortness of breath: Secondary | ICD-10-CM | POA: Insufficient documentation

## 2017-01-09 DIAGNOSIS — Y998 Other external cause status: Secondary | ICD-10-CM | POA: Insufficient documentation

## 2017-01-09 MED ORDER — ALBUTEROL SULFATE (2.5 MG/3ML) 0.083% IN NEBU
5.0000 mg | INHALATION_SOLUTION | Freq: Once | RESPIRATORY_TRACT | Status: AC
  Administered 2017-01-09: 5 mg via RESPIRATORY_TRACT
  Filled 2017-01-09: qty 6

## 2017-01-09 MED ORDER — IBUPROFEN 600 MG PO TABS
600.0000 mg | ORAL_TABLET | Freq: Once | ORAL | Status: AC
  Administered 2017-01-09: 600 mg via ORAL
  Filled 2017-01-09: qty 1

## 2017-01-09 MED ORDER — TRAMADOL HCL 50 MG PO TABS: 50.0000 mg | ORAL_TABLET | Freq: Four times a day (QID) | ORAL | 0 refills | Status: DC | PRN

## 2017-01-09 NOTE — ED Notes (Signed)
Pt with right periorbital ecchymosis, bruising noted to mid forehead. Pt states she had blurry vision from right eye. resps unlabored. Pt with hematoma noted to mid forehead, scratch with controlled bleeding less than 1mm noted to mid upper lip. No tongue trauma noted, no tooth trauma noted. Pt denies loc. Pt able to move all extremities without difficulty.
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
## 2018-04-14 ENCOUNTER — Emergency Department
Admission: EM | Admit: 2018-04-14 | Discharge: 2018-04-14 | Disposition: A | Discharge status: Home / Self Care | Payer: Managed Care, Other (non HMO) | Attending: Emergency Medicine | Admitting: Emergency Medicine

## 2018-04-14 ENCOUNTER — Other Ambulatory Visit: Payer: Self-pay

## 2018-04-14 ENCOUNTER — Encounter: Payer: Self-pay | Admitting: Emergency Medicine

## 2018-04-14 DIAGNOSIS — J45909 Unspecified asthma, uncomplicated: Secondary | ICD-10-CM | POA: Insufficient documentation

## 2018-04-14 DIAGNOSIS — N390 Urinary tract infection, site not specified: Secondary | ICD-10-CM | POA: Insufficient documentation

## 2018-04-14 DIAGNOSIS — R112 Nausea with vomiting, unspecified: Secondary | ICD-10-CM | POA: Insufficient documentation

## 2018-04-14 LAB — URINALYSIS, ROUTINE W REFLEX MICROSCOPIC
BILIRUBIN URINE: NEGATIVE
Glucose, UA: NEGATIVE mg/dL
KETONES UR: NEGATIVE mg/dL
Nitrite: NEGATIVE
PH: 5 (ref 5.0–8.0)
Protein, ur: NEGATIVE mg/dL
Specific Gravity, Urine: 1.018 (ref 1.005–1.030)

## 2018-04-14 LAB — CBC
HCT: 40.7 % (ref 35.0–47.0)
HEMOGLOBIN: 13.4 g/dL (ref 12.0–16.0)
MCH: 27.5 pg (ref 26.0–34.0)
MCHC: 32.9 g/dL (ref 32.0–36.0)
MCV: 83.4 fL (ref 80.0–100.0)
Platelets: 335 10*3/uL (ref 150–440)
RBC: 4.88 MIL/uL (ref 3.80–5.20)
RDW: 14.2 % (ref 11.5–14.5)
WBC: 7.6 10*3/uL (ref 3.6–11.0)

## 2018-04-14 LAB — POC URINE PREG, ED: PREG TEST UR: NEGATIVE — NL

## 2018-04-14 LAB — COMPREHENSIVE METABOLIC PANEL
ALK PHOS: 49 U/L (ref 38–126)
ALT: 15 U/L (ref 0–44)
AST: 17 U/L (ref 15–41)
Albumin: 4 g/dL (ref 3.5–5.0)
Anion gap: 5 (ref 5–15)
BUN: 8 mg/dL (ref 6–20)
CALCIUM: 9.1 mg/dL (ref 8.9–10.3)
CHLORIDE: 105 mmol/L (ref 98–111)
CO2: 27 mmol/L (ref 22–32)
Creatinine, Ser: 1.01 mg/dL — ABNORMAL HIGH (ref 0.44–1.00)
Glucose, Bld: 117 mg/dL — ABNORMAL HIGH (ref 70–99)
Potassium: 3.8 mmol/L (ref 3.5–5.1)
Sodium: 137 mmol/L (ref 135–145)
Total Bilirubin: 0.4 mg/dL (ref 0.3–1.2)
Total Protein: 7.7 g/dL (ref 6.5–8.1)

## 2018-04-14 LAB — LIPASE, BLOOD: LIPASE: 32 U/L (ref 11–51)

## 2018-04-14 MED ORDER — ONDANSETRON HCL 4 MG/2ML IJ SOLN
4.0000 mg | Freq: Once | INTRAMUSCULAR | Status: AC
  Administered 2018-04-14: 4 mg via INTRAVENOUS
  Filled 2018-04-14: qty 2

## 2018-04-14 MED ORDER — SODIUM CHLORIDE 0.9 % IV BOLUS
1000.0000 mL | Freq: Once | INTRAVENOUS | Status: AC
  Administered 2018-04-14: 1000 mL via INTRAVENOUS

## 2018-04-14 MED ORDER — FOSFOMYCIN TROMETHAMINE 3 G PO PACK
3.0000 g | PACK | Freq: Once | ORAL | Status: AC
  Administered 2018-04-14: 3 g via ORAL
  Filled 2018-04-14: qty 3

## 2018-04-14 NOTE — ED Provider Notes (Signed)
[facility] Emergency Department Provider Note  Time seen: 10:46 AM  I have reviewed the triage vital signs and the nursing notes.   HISTORY  Chief Complaint Abdominal Pain    HPI Laura Fleming is a 22 y.o. female with a past medical history of anemia, asthma, presents to the emergency department for abdominal discomfort nausea and vomiting.  According to the patient for the past 2 days she has been experiencing abdominal pain which she describes as mostly left upper quadrant and along the left side of her abdomen.  Does state mild lower abdominal discomfort as well.  Describes the pain as dull aching type pain.  Had nausea with one episode of vomiting this morning when she thought she saw specks or streaks of blood.  Denies any urinary symptoms.  Denies vaginal bleeding or discharge.  Denies any fever.  Currently states her discomfort is mild cramping type pain.   Past Medical History:  Diagnosis Date  . Acne   . Allergy   . Anemia   . Asthma   . Mongolian macula     Patient Active Problem List   Diagnosis Date Noted  . Pyelonephritis 04/23/2015  . Sepsis (HCC) 04/23/2015    Past Surgical History:  Procedure Laterality Date  . TYMPANOSTOMY TUBE PLACEMENT      Prior to Admission medications   Medication Sig Start Date End Date Taking? Authorizing Provider  albuterol (PROVENTIL HFA;VENTOLIN HFA) 108 (90 BASE) MCG/ACT inhaler Inhale 2 puffs into the lungs every 6 (six) hours as needed for wheezing or shortness of breath.    [provider]  albuterol (PROVENTIL HFA;VENTOLIN HFA) 108 (90 Base) MCG/ACT inhaler Inhale 2 puffs into the lungs every 4 (four) hours as needed for wheezing or shortness of breath. 05/03/16   Cuthriell, Delorise RoyalsJonathan D, PA-C  ibuprofen (ADVIL,MOTRIN) 200 MG tablet Take 400 mg by mouth every 6 (six) hours as needed for headache.    [provider]  traMADol (ULTRAM) 50 MG tablet Take 1 tablet (50 mg total) by mouth  every 6 (six) hours as needed. 01/09/17   Minna AntisPaduchowski, Virgil Lightner, MD    Allergies  Allergen Reactions  . Penicillins Other (See Comments)    Reaction:  Unknown; childhood reaction     Family History  Problem Relation Age of Onset  . Asthma Sister     Social History Social History   Tobacco Use  . Smoking status: Never Smoker  . Smokeless tobacco: Never Used  Substance Use Topics  . Alcohol use: No  . Drug use: Not on file    Review of Systems Constitutional: Negative for fever. Cardiovascular: Negative for chest pain. Respiratory: Negative for shortness of breath. Gastrointestinal: Left-sided abdominal pain.  Positive for nausea vomiting x1 this morning.  Negative for diarrhea. Genitourinary: Negative for urinary compaints.  Negative for vaginal bleeding or discharge. Musculoskeletal: Negative for musculoskeletal complaints Skin: Negative for skin complaints  Neurological: Negative for headache All other ROS negative  ____________________________________________   PHYSICAL EXAM:  VITAL SIGNS: ED Triage Vitals  Enc Vitals Group     BP 04/14/18 1006 131/76     Pulse Rate 04/14/18 1006 64     Resp 04/14/18 1006 (!) 23     Temp 04/14/18 1006 98.9 F (37.2 C)     Temp Source 04/14/18 1006 Oral     SpO2 04/14/18 1006 100 %     Weight 04/14/18 0949 169 lb 15.6 oz (77.1 kg)     Height 04/14/18  1610 5\' 5"  (1.651 m)     Head Circumference --      Peak Flow --      Pain Score 04/14/18 0949 5     Pain Loc --      Pain Edu? --      Excl. in GC? --    Constitutional: Alert and oriented. Well appearing and in no distress. Eyes: Normal exam ENT   Head: Normocephalic and atraumatic.   Mouth/Throat: Mucous membranes are moist. Cardiovascular: Normal rate, regular rhythm. No murmur Respiratory: Normal respiratory effort without tachypnea nor retractions. Breath sounds are clear and equal bilaterally. No wheezes/rales/rhonchi. Gastrointestinal: Soft, mild left-sided  abdominal tenderness.  No rebound guarding or distention. Musculoskeletal: Nontender with normal range of motion in all extremities. Neurologic:  Normal speech and language. No gross focal neurologic deficits  Skin:  Skin is warm, dry and intact.  Psychiatric: Mood and affect are normal.   ____________________________________________    INITIAL IMPRESSION / ASSESSMENT AND PLAN / ED COURSE  Pertinent labs & imaging results that were available during my care of the patient were reviewed by me and considered in my medical decision making (see chart for details).  Patient presents to the emergency department for left-sided abdominal discomfort.  Also states nausea and vomiting x1 this morning with blood specks or streaks in it.  Really the patient appears very well, minimal left-sided tenderness on exam largely benign abdominal exam.  Differential is quite broad but would include gastroenteritis, enteritis, diverticulitis or colitis, urinary tract infection or pyelonephritis, less likely pelvic inflammatory disease or pelvic disease given no discharge or vaginal complaints.  We will check labs, dose IV fluids and nausea medication we will continue to closely monitor.  Patient's labs are resulted largely within normal limits including a normal white blood cell count, urinalysis and POC pregnancy test is pending.  Patient's urinalysis shows many bacteria possibly indicating urinary tract infection.  We will dose with a one-time dose of fosfomycin I have added on a urine culture.  Patient continues to appear well, with normal vitals and otherwise reassuring labs we will discharge the patient.  I discussed return precautions for any worsening pain or development of fever.  Otherwise the patient will follow-up with her primary care doctor.  ____________________________________________   FINAL CLINICAL IMPRESSION(S) / ED DIAGNOSES  Abdominal pain Vomiting Urinary tract infection   Minna Antis, MD 04/14/18 1414

## 2018-04-14 NOTE — ED Triage Notes (Signed)
First Nurse Note:  C/O mid abdominal pain, nausea, and emesis x 1 week. States symptoms are worse after eating.

## 2018-04-17 LAB — URINE CULTURE: Culture: >=100000 — AB

## 2018-04-20 LAB — HM HIV SCREENING LAB: HM HIV Screening: NEGATIVE
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
## 2019-09-29 ENCOUNTER — Other Ambulatory Visit: Payer: Self-pay

## 2019-09-29 ENCOUNTER — Encounter: Payer: Self-pay | Admitting: Emergency Medicine

## 2019-09-29 ENCOUNTER — Emergency Department
Admission: EM | Admit: 2019-09-29 | Discharge: 2019-09-29 | Disposition: A | Discharge status: Home / Self Care | Payer: Self-pay | Attending: Emergency Medicine | Admitting: Emergency Medicine

## 2019-09-29 ENCOUNTER — Emergency Department: Payer: Self-pay

## 2019-09-29 DIAGNOSIS — J45909 Unspecified asthma, uncomplicated: Secondary | ICD-10-CM | POA: Insufficient documentation

## 2019-09-29 DIAGNOSIS — B9689 Other specified bacterial agents as the cause of diseases classified elsewhere: Secondary | ICD-10-CM | POA: Insufficient documentation

## 2019-09-29 DIAGNOSIS — N3090 Cystitis, unspecified without hematuria: Secondary | ICD-10-CM | POA: Insufficient documentation

## 2019-09-29 DIAGNOSIS — N760 Acute vaginitis: Secondary | ICD-10-CM | POA: Insufficient documentation

## 2019-09-29 LAB — URINALYSIS, COMPLETE (UACMP) WITH MICROSCOPIC
Bilirubin Urine: NEGATIVE
Glucose, UA: NEGATIVE mg/dL
Ketones, ur: 5 mg/dL — AB
Nitrite: NEGATIVE
Protein, ur: NEGATIVE mg/dL
Specific Gravity, Urine: 1.024 (ref 1.005–1.030)
pH: 5 (ref 5.0–8.0)

## 2019-09-29 LAB — CBC
HCT: 41.2 % (ref 36.0–46.0)
Hemoglobin: 13.2 g/dL (ref 12.0–15.0)
MCH: 27.7 pg (ref 26.0–34.0)
MCHC: 32 g/dL (ref 30.0–36.0)
MCV: 86.6 fL (ref 80.0–100.0)
Platelets: 313 10*3/uL (ref 150–400)
RBC: 4.76 MIL/uL (ref 3.87–5.11)
RDW: 13 % (ref 11.5–15.5)
WBC: 10 10*3/uL (ref 4.0–10.5)
nRBC: 0 % (ref 0.0–0.2)

## 2019-09-29 LAB — CHLAMYDIA/NGC RT PCR (ARMC ONLY)??????????: N gonorrhoeae: NOT DETECTED

## 2019-09-29 LAB — LIPASE, BLOOD: Lipase: 28 U/L (ref 11–51)

## 2019-09-29 LAB — COMPREHENSIVE METABOLIC PANEL
ALT: 13 U/L (ref 0–44)
AST: 18 U/L (ref 15–41)
Albumin: 4.4 g/dL (ref 3.5–5.0)
Alkaline Phosphatase: 50 U/L (ref 38–126)
Anion gap: 7 (ref 5–15)
BUN: 13 mg/dL (ref 6–20)
CO2: 24 mmol/L (ref 22–32)
Calcium: 9.2 mg/dL (ref 8.9–10.3)
Chloride: 106 mmol/L (ref 98–111)
Creatinine, Ser: 1 mg/dL (ref 0.44–1.00)
GFR calc Af Amer: >60 mL/min (ref >60)
GFR calc non Af Amer: >60 mL/min (ref >60)
Glucose, Bld: 101 mg/dL — ABNORMAL HIGH (ref 70–99)
Potassium: 3.8 mmol/L (ref 3.5–5.1)
Sodium: 137 mmol/L (ref 135–145)
Total Bilirubin: 0.5 mg/dL (ref 0.3–1.2)
Total Protein: 7.9 g/dL (ref 6.5–8.1)

## 2019-09-29 LAB — WET PREP, GENITAL
Sperm: NONE SEEN
Trich, Wet Prep: NONE SEEN
Yeast Wet Prep HPF POC: NONE SEEN

## 2019-09-29 LAB — PREGNANCY, URINE: Preg Test, Ur: NEGATIVE

## 2019-09-29 LAB — CHLAMYDIA/NGC RT PCR (ARMC ONLY): Chlamydia Tr: NOT DETECTED

## 2019-09-29 MED ORDER — METRONIDAZOLE 500 MG PO TABS: 500.0000 mg | ORAL_TABLET | Freq: Two times a day (BID) | ORAL | 0 refills | Status: AC

## 2019-09-29 MED ORDER — SODIUM CHLORIDE 0.9% FLUSH: 3.0000 mL | Freq: Once | INTRAVENOUS | Status: DC

## 2019-09-29 MED ORDER — SULFAMETHOXAZOLE-TRIMETHOPRIM 800-160 MG PO TABS: 1.0000 | ORAL_TABLET | Freq: Two times a day (BID) | ORAL | 0 refills | Status: DC

## 2019-09-29 NOTE — Discharge Instructions (Signed)
You were given Flagyl for bacterial vaginosis and Bactrim for urinary tract infection.  Your lab work and CT scan are reassuring.  Your gonorrhea and Chlamydia tests are pending and you will be called if they are positive.

## 2019-09-29 NOTE — ED Provider Notes (Signed)
[facility] Emergency Department Provider Note  ____________________________________________  Time seen: Approximately 6:06 PM  I have reviewed the triage vital signs and the nursing notes.   HISTORY  Chief Complaint Abdominal Pain    HPI Laura Fleming is a 24 y.o. female that presents to the emergency department for evaluation of left-sided upper and lower abdominal pain for 1.5 months.  Patient states that pain will sometimes feel like cramping.  Patient has a Nexplanon that is due to be changed in April.  She had some light vaginal spotting last month but previously had not been having menstrual cycles.  She does urinate frequently but also drinks a lot of water.  No fevers.  No vomiting, diarrhea, constipation.   Past Medical History:  Diagnosis Date  . Acne   . Allergy   . Anemia   . Asthma   . Mongolian macula     Patient Active Problem List   Diagnosis Date Noted  . Pyelonephritis 04/23/2015  . Sepsis (HCC) 04/23/2015    Past Surgical History:  Procedure Laterality Date  . TYMPANOSTOMY TUBE PLACEMENT      Prior to Admission medications   Medication Sig Start Date End Date Taking? Authorizing Provider  albuterol (PROVENTIL HFA;VENTOLIN HFA) 108 (90 Base) MCG/ACT inhaler Inhale 2 puffs into the lungs every 4 (four) hours as needed for wheezing or shortness of breath. 05/03/16   Cuthriell, Delorise Royals, PA-C  ibuprofen (ADVIL,MOTRIN) 200 MG tablet Take 400 mg by mouth every 6 (six) hours as needed for headache.    [provider]  metroNIDAZOLE (FLAGYL) 500 MG tablet Take 1 tablet (500 mg total) by mouth 2 (two) times daily for 7 days. 09/29/19 10/06/19  Enid Derry, PA-C  sulfamethoxazole-trimethoprim (BACTRIM DS) 800-160 MG tablet Take 1 tablet by mouth 2 (two) times daily. 09/29/19   Enid Derry, PA-C  traMADol (ULTRAM) 50 MG tablet Take 1 tablet (50 mg total) by mouth every 6 (six) hours as needed. Patient not taking: Reported on  04/14/2018 01/09/17   Minna Antis, MD    Allergies Penicillins  Family History  Problem Relation Age of Onset  . Asthma Sister     Social History Social History   Tobacco Use  . Smoking status: Never Smoker  . Smokeless tobacco: Never Used  Substance Use Topics  . Alcohol use: No  . Drug use: Not on file     Review of Systems  Constitutional: No fever/chills ENT: No upper respiratory complaints. Cardiovascular: No chest pain. Respiratory: No cough. No SOB. Gastrointestinal: Positive for abdominal pain and nausea. No vomiting.  Genitourinary: Negative for dysuria. Positive for frequency. Musculoskeletal: Negative for musculoskeletal pain. Skin: Negative for rash, abrasions, lacerations, ecchymosis. Neurological: Negative for headaches, numbness or tingling   ____________________________________________   PHYSICAL EXAM:  VITAL SIGNS: ED Triage Vitals  Enc Vitals Group     BP 09/29/19 1435 138/86     Pulse Rate 09/29/19 1435 65     Resp 09/29/19 1435 18     Temp 09/29/19 1435 98.4 F (36.9 C)     Temp Source 09/29/19 1435 Oral     SpO2 09/29/19 1435 99 %     Weight 09/29/19 1436 188 lb 4.4 oz (85.4 kg)     Height 09/29/19 1436 5\' 6"  (1.676 m)     Head Circumference --      Peak Flow --      Pain Score 09/29/19 1450 8     Pain Loc --  Pain Edu? --      Excl. in GC? --      Constitutional: Alert and oriented. Well appearing and in no acute distress. Eyes: Conjunctivae are normal. PERRL. EOMI. Head: Atraumatic. ENT:      Ears:      Nose: No congestion/rhinnorhea.      Mouth/Throat: Mucous membranes are moist.  Neck: No stridor.   Cardiovascular: Normal rate, regular rhythm.  Good peripheral circulation. Respiratory: Normal respiratory effort without tachypnea or retractions. Lungs CTAB. Good air entry to the bases with no decreased or absent breath sounds. Gastrointestinal: Bowel sounds 4 quadrants.  Abdomen is soft.  Minimal tenderness to  left upper and lower quadrants.  No guarding or rigidity. No palpable masses. No distention.  Musculoskeletal: Full range of motion to all extremities. No gross deformities appreciated. Neurologic:  Normal speech and language. No gross focal neurologic deficits are appreciated.  Skin:  Skin is warm, dry and intact. No rash noted. Psychiatric: Mood and affect are normal. Speech and behavior are normal. Patient exhibits appropriate insight and judgement.   ____________________________________________   LABS (all labs ordered are listed, but only abnormal results are displayed)  Labs Reviewed  WET PREP, GENITAL - Abnormal; Notable for the following components:      Result Value   Clue Cells Wet Prep HPF POC PRESENT (*)    WBC, Wet Prep HPF POC FEW (*)    All other components within normal limits  COMPREHENSIVE METABOLIC PANEL - Abnormal; Notable for the following components:   Glucose, Bld 101 (*)    All other components within normal limits  URINALYSIS, COMPLETE (UACMP) WITH MICROSCOPIC - Abnormal; Notable for the following components:   Color, Urine YELLOW (*)    APPearance HAZY (*)    Hgb urine dipstick SMALL (*)    Ketones, ur 5 (*)    Leukocytes,Ua SMALL (*)    Bacteria, UA RARE (*)    All other components within normal limits  CHLAMYDIA/NGC RT PCR ([facility] ONLY)  URINE CULTURE  LIPASE, BLOOD  CBC  PREGNANCY, URINE  POC URINE PREG, ED   ____________________________________________  EKG   ____________________________________________  RADIOLOGY   CT Renal Stone Study  Result Date: 09/29/2019 CLINICAL DATA:  24 year old female with abdominal pain and nausea. Concern for kidney stone. EXAM: CT ABDOMEN AND PELVIS WITHOUT CONTRAST TECHNIQUE: Multidetector CT imaging of the abdomen and pelvis was performed following the standard protocol without IV contrast. COMPARISON:  None. FINDINGS: Evaluation of this exam is limited in the absence of intravenous contrast. Lower  chest: The visualized lung bases are clear. No intra-abdominal free air or free fluid. Hepatobiliary: No focal liver abnormality is seen. No gallstones, gallbladder wall thickening, or biliary dilatation. Pancreas: Unremarkable. No pancreatic ductal dilatation or surrounding inflammatory changes. Spleen: Normal in size without focal abnormality. Adrenals/Urinary Tract: The adrenal glands are unremarkable. There is no hydronephrosis or nephrolithiasis on either side. The visualized ureters and urinary bladder appear unremarkable. Stomach/Bowel: There is no bowel obstruction or active inflammation. The appendix is normal. Vascular/Lymphatic: The abdominal aorta and IVC are unremarkable. No portal venous gas. There is no adenopathy. Reproductive: The uterus is anteverted and grossly unremarkable. No adnexa masses. Other: None Musculoskeletal: No acute or significant osseous findings. IMPRESSION: No acute intra-abdominal or pelvic pathology. No hydronephrosis or nephrolithiasis. Electronically Signed   By: Elgie Collard M.D.   On: 09/29/2019 18:10    ____________________________________________    PROCEDURES  Procedure(s) performed:    Procedures    Medications  sodium chloride flush (NS) 0.9 % injection 3 mL (3 mLs Intravenous Not Given 09/29/19 1552)     ____________________________________________   INITIAL IMPRESSION / ASSESSMENT AND PLAN / ED COURSE  Pertinent labs & imaging results that were available during my care of the patient were reviewed by me and considered in my medical decision making (see chart for details).  Review of the [place] CSRS was performed in accordance of the [place] prior to dispensing any controlled drugs.   Patient's diagnosis is consistent with bacterial vaginosis and cystititis.  CBC and CMP are largely unremarkable.  Lipase within reference range.  Abdomen is soft and minimally tender to palpation.  Urinalysis shows blood, leukocytes, white blood cells,  bacteria.  Wet prep positive for clue cells.  Gonorrhea and Chlamydia are negative.  No acute abnormalities on abdominal CT.  Patient will be discharged home with prescriptions for flagyl and Bactim. Patient is to follow up with PCP as directed. Patient is given ED precautions to return to the ED for any worsening or new symptoms.   TOPEKA GIAMMONA was evaluated in Emergency Department on 09/29/2019 for the symptoms described in the history of present illness. She was evaluated in the context of the global COVID-19 pandemic, which necessitated consideration that the patient might be at risk for infection with the SARS-CoV-2 virus that causes COVID-19. Institutional protocols and algorithms that pertain to the evaluation of patients at risk for COVID-19 are in a state of rapid change based on information released by regulatory bodies including the CDC and federal and state organizations. These policies and algorithms were followed during the patient's care in the ED.  ____________________________________________  FINAL CLINICAL IMPRESSION(S) / ED DIAGNOSES  Final diagnoses:  Bacterial vaginosis  Cystitis      NEW MEDICATIONS STARTED DURING THIS VISIT:  ED Discharge Orders         Ordered    metroNIDAZOLE (FLAGYL) 500 MG tablet  2 times daily     09/29/19 1830    sulfamethoxazole-trimethoprim (BACTRIM DS) 800-160 MG tablet  2 times daily     09/29/19 1832              This chart was dictated using voice recognition software/Dragon. Despite best efforts to proofread, errors can occur which can change the meaning. Any change was purely unintentional.    Laban Emperor, PA-C 09/29/19 2332    Drenda Freeze, MD 09/30/19 [phone]

## 2019-09-29 NOTE — ED Triage Notes (Signed)
PT to ER with c/o abdominal pain and nausea.  States abdominal pain for approx 1 month.  Nausea started today.  States pain is worse today.

## 2019-09-29 NOTE — ED Notes (Signed)
Patient's abdomen is soft. Patient states she urinates frequently, but also drinks a lot of water. Patient is concerned that pain is related to birth control implant which is due to be changed. Patient states she had spotting recently and hasn't had a period with the Nexplanon.

## 2019-09-29 NOTE — ED Notes (Signed)
First Nurse Note: Pt ambulatory into ED c/o abd pain. Pt is in NAD.

## 2019-10-02 LAB — URINE CULTURE: Culture: >=100000 — AB

## 2019-11-19 ENCOUNTER — Telehealth: Payer: Self-pay | Admitting: Family Medicine

## 2019-11-19 NOTE — Telephone Encounter (Signed)
Patient would like Nexplanon removed and possible reinsertion but has questions about her next period.

## 2019-11-22 NOTE — Telephone Encounter (Signed)
Returned patient phone call. No answer, LMTC. Marveen Donlon, RN  

## 2019-11-27 ENCOUNTER — Telehealth: Payer: Self-pay | Admitting: General Practice

## 2019-11-27 NOTE — Telephone Encounter (Signed)
As no return call from client in repsonse to previous message left by C. Lucretia Roers Charity fundraiser, phone call made to client. Left message to call to discuss her questions and number to call provided. Jossie Ng, RN

## 2019-11-27 NOTE — Telephone Encounter (Signed)
regarding removing nexplanon

## 2019-12-04 NOTE — Telephone Encounter (Signed)
Phone call to pt. Left message on voicemail that RN with [facility] is returning phone call, if still need assistance please call [phone].  

## 2019-12-04 NOTE — Telephone Encounter (Addendum)
Today: Phone call to pt. Left message on voicemail that RN with [facility] is returning phone call, if still need assistance please call [phone].  (See additional phone note/encounters. Unable to reach pt.)

## 2019-12-06 NOTE — Telephone Encounter (Signed)
Per [facility] records:  Pt had nexplanon removal and reinsertion with [facility] on 11/18/2016. Last physical with [facility] 11/11/2016. PAP due 2019.

## 2019-12-06 NOTE — Telephone Encounter (Signed)
Phone call to pt. Left message on voicemail that RN with [facility] is returning call, if still need assistance please call [phone].

## 2019-12-06 NOTE — Telephone Encounter (Signed)
Phone call to pt. Left message on voicemail that RN with [facility] is returning phone call, if still need assistance please call [phone].  

## 2020-02-08 ENCOUNTER — Emergency Department: Payer: Self-pay

## 2020-02-08 ENCOUNTER — Other Ambulatory Visit: Payer: Self-pay

## 2020-02-08 ENCOUNTER — Emergency Department
Admission: EM | Admit: 2020-02-08 | Discharge: 2020-02-08 | Disposition: A | Discharge status: Home / Self Care | Payer: Self-pay | Attending: Emergency Medicine | Admitting: Emergency Medicine

## 2020-02-08 DIAGNOSIS — J45909 Unspecified asthma, uncomplicated: Secondary | ICD-10-CM | POA: Insufficient documentation

## 2020-02-08 DIAGNOSIS — F17210 Nicotine dependence, cigarettes, uncomplicated: Secondary | ICD-10-CM | POA: Insufficient documentation

## 2020-02-08 DIAGNOSIS — R1011 Right upper quadrant pain: Secondary | ICD-10-CM | POA: Insufficient documentation

## 2020-02-08 DIAGNOSIS — R1010 Upper abdominal pain, unspecified: Secondary | ICD-10-CM | POA: Insufficient documentation

## 2020-02-08 DIAGNOSIS — Z7951 Long term (current) use of inhaled steroids: Secondary | ICD-10-CM | POA: Insufficient documentation

## 2020-02-08 LAB — URINALYSIS, COMPLETE (UACMP) WITH MICROSCOPIC
Bilirubin Urine: NEGATIVE
Glucose, UA: NEGATIVE mg/dL
Hgb urine dipstick: NEGATIVE
Ketones, ur: NEGATIVE mg/dL
Nitrite: NEGATIVE
Protein, ur: NEGATIVE mg/dL
Specific Gravity, Urine: 1.014 (ref 1.005–1.030)
pH: 6 (ref 5.0–8.0)

## 2020-02-08 LAB — COMPREHENSIVE METABOLIC PANEL
ALT: 16 U/L (ref 0–44)
AST: 17 U/L (ref 15–41)
Albumin: 4 g/dL (ref 3.5–5.0)
Alkaline Phosphatase: 38 U/L (ref 38–126)
Anion gap: 7 (ref 5–15)
BUN: 9 mg/dL (ref 6–20)
CO2: 26 mmol/L (ref 22–32)
Calcium: 8.8 mg/dL — ABNORMAL LOW (ref 8.9–10.3)
Chloride: 105 mmol/L (ref 98–111)
Creatinine, Ser: 0.95 mg/dL (ref 0.44–1.00)
GFR calc Af Amer: >60 mL/min (ref >60)
GFR calc non Af Amer: >60 mL/min (ref >60)
Glucose, Bld: 101 mg/dL — ABNORMAL HIGH (ref 70–99)
Potassium: 4 mmol/L (ref 3.5–5.1)
Sodium: 138 mmol/L (ref 135–145)
Total Bilirubin: 0.6 mg/dL (ref 0.3–1.2)
Total Protein: 7.6 g/dL (ref 6.5–8.1)

## 2020-02-08 LAB — CBC
HCT: 40.6 % (ref 36.0–46.0)
Hemoglobin: 13.3 g/dL (ref 12.0–15.0)
MCH: 28.4 pg (ref 26.0–34.0)
MCHC: 32.8 g/dL (ref 30.0–36.0)
MCV: 86.6 fL (ref 80.0–100.0)
Platelets: 339 10*3/uL (ref 150–400)
RBC: 4.69 MIL/uL (ref 3.87–5.11)
RDW: 13.1 % (ref 11.5–15.5)
WBC: 7.6 10*3/uL (ref 4.0–10.5)
nRBC: 0 % (ref 0.0–0.2)

## 2020-02-08 LAB — POCT PREGNANCY, URINE: Preg Test, Ur: NEGATIVE

## 2020-02-08 LAB — LIPASE, BLOOD: Lipase: 36 U/L (ref 11–51)

## 2020-02-08 MED ORDER — SODIUM CHLORIDE 0.9% FLUSH: 3.0000 mL | Freq: Once | INTRAVENOUS | Status: DC

## 2020-02-08 MED ORDER — DICYCLOMINE HCL 10 MG/ML IM SOLN
20.0000 mg | Freq: Once | INTRAMUSCULAR | Status: AC
  Administered 2020-02-08: 20 mg via INTRAMUSCULAR
  Filled 2020-02-08: qty 2

## 2020-02-08 MED ORDER — IOHEXOL 300 MG/ML  SOLN
100.0000 mL | Freq: Once | INTRAMUSCULAR | Status: AC | PRN
  Administered 2020-02-08: 100 mL via INTRAVENOUS
  Filled 2020-02-08: qty 100

## 2020-02-08 MED ORDER — DICYCLOMINE HCL 10 MG PO CAPS: 10.0000 mg | ORAL_CAPSULE | Freq: Four times a day (QID) | ORAL | 0 refills | Status: DC

## 2020-02-08 NOTE — ED Provider Notes (Signed)
[facility] Emergency Department Provider Note  ____________________________________________   First MD Initiated Contact with Patient 02/08/20 1217     (approximate)  I have reviewed the triage vital signs and the nursing notes.   HISTORY  Chief Complaint Abdominal Pain    HPI JAMESIA LINNEN is a 24 y.o. female presents emergency department with abdominal pain that started worsening last night.  Patient ate fried fish, pinto beans and corn last night for dinner.   Complains of continued nausea.  Patient had a bowel movement yesterday.  Denies fever or chills.  No actual vomiting.  No diarrhea.  Patient states her stomach is so bloated she cannot breathe well.   Past Medical History:  Diagnosis Date  . Acne   . Allergy   . Anemia   . Asthma   . Mongolian macula     Patient Active Problem List   Diagnosis Date Noted  . Pyelonephritis 04/23/2015  . Sepsis (HCC) 04/23/2015    Past Surgical History:  Procedure Laterality Date  . TYMPANOSTOMY TUBE PLACEMENT      Prior to Admission medications   Medication Sig Start Date End Date Taking? Authorizing Provider  albuterol (PROVENTIL HFA;VENTOLIN HFA) 108 (90 Base) MCG/ACT inhaler Inhale 2 puffs into the lungs every 4 (four) hours as needed for wheezing or shortness of breath. 05/03/16   Cuthriell, Delorise Royals, PA-C  dicyclomine (BENTYL) 10 MG capsule Take 1 capsule (10 mg total) by mouth 4 (four) times daily for 14 days. 02/08/20 02/22/20  Yamen Castrogiovanni, Roselyn Bering, PA-C  ibuprofen (ADVIL,MOTRIN) 200 MG tablet Take 400 mg by mouth every 6 (six) hours as needed for headache.    [provider]  sulfamethoxazole-trimethoprim (BACTRIM DS) 800-160 MG tablet Take 1 tablet by mouth 2 (two) times daily. 09/29/19   Enid Derry, PA-C  traMADol (ULTRAM) 50 MG tablet Take 1 tablet (50 mg total) by mouth every 6 (six) hours as needed. Patient not taking: Reported on 04/14/2018 01/09/17   Minna Antis, MD     Allergies Penicillins  Family History  Problem Relation Age of Onset  . Asthma Sister     Social History Social History   Tobacco Use  . Smoking status: Current Every Day Smoker    Types: Cigarettes  . Smokeless tobacco: Never Used  Substance Use Topics  . Alcohol use: No  . Drug use: Not on file    Review of Systems  Constitutional: No fever/chills Eyes: No visual changes. ENT: No sore throat. Respiratory: Denies cough Cardiovascular: Denies chest pain Gastrointestinal: Positive abdominal pain Genitourinary: Negative for dysuria. Musculoskeletal: Negative for back pain. Skin: Negative for rash. Psychiatric: no mood changes,     ____________________________________________   PHYSICAL EXAM:  VITAL SIGNS: ED Triage Vitals [02/08/20 1011]  Enc Vitals Group     BP 133/85     Pulse Rate 72     Resp 17     Temp 98.4 F (36.9 C)     Temp Source Oral     SpO2 100 %     Weight 180 lb (81.6 kg)     Height 5\' 6"  (1.676 m)     Head Circumference      Peak Flow      Pain Score 7     Pain Loc      Pain Edu?      Excl. in GC?     Constitutional: Alert and oriented. Well appearing and in no acute distress. Eyes: Conjunctivae are normal.  Head: Atraumatic. Nose: No congestion/rhinnorhea. Mouth/Throat: Mucous membranes are moist.   Neck:  supple no lymphadenopathy noted Cardiovascular: Normal rate, regular rhythm. Heart sounds are normal Respiratory: Normal respiratory effort.  No retractions, lungs c t a  Abd: soft tender in the upper quadrants bilaterally, decreased bowel sounds in the upper quadrants, slow bowel sounds noted in the lower quadrants GU: deferred Musculoskeletal: FROM all extremities, warm and well perfused Neurologic:  Normal speech and language.  Skin:  Skin is warm, dry and intact. No rash noted. Psychiatric: Mood and affect are normal. Speech and behavior are normal.  ____________________________________________   LABS (all labs  ordered are listed, but only abnormal results are displayed)  Labs Reviewed  COMPREHENSIVE METABOLIC PANEL - Abnormal; Notable for the following components:      Result Value   Glucose, Bld 101 (*)    Calcium 8.8 (*)    All other components within normal limits  URINALYSIS, COMPLETE (UACMP) WITH MICROSCOPIC - Abnormal; Notable for the following components:   Color, Urine YELLOW (*)    APPearance CLOUDY (*)    Leukocytes,Ua TRACE (*)    Bacteria, UA RARE (*)    All other components within normal limits  LIPASE, BLOOD  CBC  POC URINE PREG, ED  POCT PREGNANCY, URINE   ____________________________________________   ____________________________________________  RADIOLOGY  KUB shows questionable bowel obstruction Ultrasound right upper quadrant is negative CT abdomen pelvis is negative  ____________________________________________   PROCEDURES  Procedure(s) performed: Bentyl IM   Procedures    ____________________________________________   INITIAL IMPRESSION / ASSESSMENT AND PLAN / ED COURSE  Pertinent labs & imaging results that were available during my care of the patient were reviewed by me and considered in my medical decision making (see chart for details).   Patient is a 24 year old female presents emergency department with abdominal pain and bloating.  See HPI  Physical exam shows patient's abdomen to be distended, tender in the upper quadrants bilaterally, decreased bowel sounds in the upper quadrants, slow bowel sounds in the lower quadrants bilaterally.  DDx: Acute abdominal pain, acute cholecystitis, acute bowel obstruction  CBC is normal, metabolic panel is normal, urinalysis has trace of leuks and rare bacteria, lipase normal, POC pregnancy negative  KUB of the abdomen shows decreased air in the upper abdomen.  Concerns for bowel obstruction. Ultrasound right upper quadrant CT abdomen pelvis   Ultrasound right upper quadrant is negative, CT  abdomen/pelvis is negative.  Patient is feeling better after the Bentyl injection.  She states she is feeling well and can go home.  She was given a prescription for Bentyl.  She was advised of all lab and CT results.  She is to return to emergency department worsening.  Follow-up with GI if she continues to have these type symptoms.  She is discharged stable condition.   As part of my medical decision making, I reviewed the following data within the electronic MEDICAL RECORD NUMBER Nursing notes reviewed and incorporated, Labs reviewed , Old chart reviewed, Radiograph reviewed , Notes from prior ED visits and  Controlled Substance Database  ____________________________________________   FINAL CLINICAL IMPRESSION(S) / ED DIAGNOSES  Final diagnoses:  RUQ pain  Pain of upper abdomen      NEW MEDICATIONS STARTED DURING THIS VISIT:  New Prescriptions   DICYCLOMINE (BENTYL) 10 MG CAPSULE    Take 1 capsule (10 mg total) by mouth 4 (four) times daily for 14 days.     Note:  This document was prepared using  [employer] and may include unintentional dictation errors.    Faythe Ghee, PA-C 02/08/20 1524    Jene Every, MD 02/11/20 1255

## 2020-02-08 NOTE — Discharge Instructions (Addendum)
Follow-up with your regular doctor if not improving in 2 to 3 days.  Return emergency department worsening.  Continue to have these type symptoms you should follow-up with a GI specialist.  Can call Dr. Vern Claude office for appointment.  Or any gastroenterologist of your choice. Take the Bentyl for abdominal cramping and spasms.

## 2020-02-08 NOTE — ED Triage Notes (Signed)
Pt c/o abd pain with distention since last night, states this happens when she eats certain foods. Denies N/V/D.Marland Kitchen pt states "im not pregnant" my stomach is just bloated.

## 2020-02-08 NOTE — ED Notes (Signed)
AAOx3.  Skin warm and dry.  NAD 

## 2020-02-08 NOTE — ED Notes (Signed)
See triage note, pt c/o abdominal pain that started worsening last night, states pain gets worse when eating certain foods. +nausea, denies vomiting, diarrhea, fevers.  Reports last BM yesterday

## 2020-02-12 ENCOUNTER — Telehealth: Payer: Self-pay

## 2020-02-12 NOTE — Telephone Encounter (Signed)
Individual has been contacted 3+ times regarding ED referral. No further attempts to contact individual will be made. 

## 2020-03-14 ENCOUNTER — Ambulatory Visit: Payer: Self-pay

## 2020-03-14 ENCOUNTER — Encounter: Payer: Self-pay | Admitting: Physician Assistant

## 2020-03-14 ENCOUNTER — Ambulatory Visit (LOCAL_COMMUNITY_HEALTH_CENTER): Payer: Self-pay | Admitting: Physician Assistant

## 2020-03-14 ENCOUNTER — Other Ambulatory Visit: Payer: Self-pay

## 2020-03-14 VITALS — BP 146/90 | Ht 64.0 in | Wt 190.0 lb

## 2020-03-14 DIAGNOSIS — Z3046 Encounter for surveillance of implantable subdermal contraceptive: Secondary | ICD-10-CM

## 2020-03-14 DIAGNOSIS — Z30017 Encounter for initial prescription of implantable subdermal contraceptive: Secondary | ICD-10-CM

## 2020-03-14 DIAGNOSIS — Z3009 Encounter for other general counseling and advice on contraception: Secondary | ICD-10-CM

## 2020-03-14 DIAGNOSIS — Z0000 Encounter for general adult medical examination without abnormal findings: Secondary | ICD-10-CM

## 2020-03-14 MED ORDER — ETONOGESTREL 68 MG ~~LOC~~ IMPL
68.0000 mg | DRUG_IMPLANT | Freq: Once | SUBCUTANEOUS | Status: AC
  Administered 2020-03-14: 68 mg via SUBCUTANEOUS

## 2020-03-14 NOTE — Progress Notes (Signed)
Patient here for Nexplanon removal/reinsertion, needs PE. Last visit was 04/2018. Patient arrived late and is aware.Burt Knack, RN

## 2020-03-14 NOTE — Progress Notes (Signed)
Family Planning Visit- Repeat Yearly Visit  Subjective:  Laura Fleming is a 24 y.o. G0P0000  being seen today for an well woman visit and to discuss family planning options.    She is currently using Nexplanon for pregnancy prevention. Patient reports she does not  want a pregnancy in the next year. Patient  has Pyelonephritis and Sepsis (HCC) on their problem list.  Chief Complaint  Patient presents with  . Contraception    PE and Nex Rem/Rein    Patient reports that she would like a Nexplanon removal/reinsertion today.  States that she does not have time for her physical and the changing of her Nexplanon so would like to RTC for her PE and pap in a few weeks and only have the Nexplanon part done today.  Patient denies any changes in personal and family history today.  Denies any concerns.   See flowsheet for other program required questions.   Body mass index is 32.61 kg/m. - Patient is eligible for diabetes screening based on BMI and age >75?  not applicable HA1C ordered? not applicable  Patient reports 1 of partners in last year. Desires STI screening?  No-patient declines.   Has patient been screened once for HCV in the past?  No  No results found for: HCVAB  Does the patient have current of drug use, have a partner with drug use, and/or has been incarcerated since last result? No  If yes-- Screen for HCV through [facility]   Does the patient meet criteria for HBV testing? No  Criteria:  -Household, sexual or needle sharing contact with HBV -History of drug use -HIV positive -Those with known Hep C   Health Maintenance Due  Topic Date Due  . Hepatitis C Screening  Never done  . COVID-19 Vaccine (1) Never done  . TETANUS/TDAP  03/26/2017  . PAP-Cervical Cytology Screening  Never done  . PAP SMEAR-Modifier  Never done  . INFLUENZA VACCINE  03/02/2020    Review of Systems  All other systems reviewed and are negative.   The following portions of the  patient's history were reviewed and updated as appropriate: allergies, current medications, past family history, past medical history, past social history, past surgical history and problem list. Problem list updated.  Objective:   Vitals:   03/14/20 0942  BP: (!) 146/90  Weight: 190 lb (86.2 kg)  Height: 5\' 4"  (1.626 m)    Physical Exam Vitals and nursing note reviewed.  Constitutional:      General: She is not in acute distress.    Appearance: Normal appearance.  HENT:     Head: Normocephalic and atraumatic.  Eyes:     Conjunctiva/sclera: Conjunctivae normal.  Pulmonary:     Effort: Pulmonary effort is normal.  Neurological:     Mental Status: She is alert and oriented to person, place, and time.  Psychiatric:        Mood and Affect: Mood normal.        Behavior: Behavior normal.        Thought Content: Thought content normal.        Judgment: Judgment normal.       Assessment and Plan:  Laura Fleming is a 24 y.o. female G0P0000 presenting to the [facility] Department for an yearly well woman exam/family planning visit  Contraception counseling: Reviewed all forms of birth control options in the tiered based approach. available including abstinence; over the counter/barrier methods; hormonal contraceptive medication including pill,  patch, ring, injection,contraceptive implant, ECP; hormonal and nonhormonal IUDs; permanent sterilization options including vasectomy and the various tubal sterilization modalities. Risks, benefits, and typical effectiveness rates were reviewed.  Questions were answered.  Written information was also given to the patient to review.  Patient desires Nexplanon removal and reinsertion today, this was prescribed for patient. She will follow up in  2-3 weeks for PE and pap and prn for surveillance.  She was told to call with any further questions, or with any concerns about this method of contraception.  Emphasized use of condoms 100% of the  time for STI prevention.  Patient was not a candidate for ECP today.   1. Encounter for counseling regarding contraception Counseled patient re:  SE of Nexplanon and when to call clinic for concerns or irregular bleeding. Rec condoms or dental dams with all sex.  2. Well woman exam (no gynecological exam) Reviewed with patient healthy habits to maintain general health. Rec MVI 1 po daily. Enc to establish with/follow up with PCP for further evaluation of elevated BP, primary care concerns and illness.  3. Encounter for removal and reinsertion of Nexplanon Nexplanon Removal and Insertion  Patient identified, informed consent performed, consent signed.   Patient does understand that irregular bleeding is a very common side effect of this medication. She was advised to have backup contraception for one week after replacement of the implant. Patient deemed to meet WHO criteria for being reasonably certain she is not pregnant.  Appropriate time out taken. Nexplanon site identified. Area prepped in usual sterile fashon. 2 ml of 1% lidocaine with epinephrine was used to anesthetize the area at the distal end of the implant. A small stab incision was made right beside the implant on the distal portion. The Nexplanon rod was grasped using hemostats and removed without difficulty. There was minimal blood loss. There were no complications.   Confirmed correct location of insertion site. The insertion site was identified 8-10 cm (3-4 inches) from the medial epicondyle of the humerus and 3-5 cm (1.25-2 inches) posterior to (below) the sulcus (groove) between the biceps and triceps muscles of the patient's left arm. New Nexplanon removed from packaging, Device confirmed in needle, then inserted full length of needle and withdrawn per handbook instructions. Nexplanon was able to palpated in the patient's left arm; patient palpated the insert herself.  There was minimal blood loss. Patient insertion site covered  with guaze and a pressure bandage to reduce any bruising. The patient tolerated the procedure well and was given post procedure instructions.    Nexplanon:   Counseled patient to take OTC analgesic starting as soon as lidocaine starts to wear off and take regularly for at least 48 hr to decrease discomfort.  Specifically to take with food or milk to decrease stomach upset and for IB 600 mg (3 tablets) every 6 hrs; IB 800 mg (4 tablets) every 8 hrs; or Aleve 2 tablets every 12 hrs.   - etonogestrel (NEXPLANON) implant 68 mg     Return in about 2 weeks (around 03/28/2020) for PE with pap.  No future appointments.  Matt Holmes, PA

## 2020-11-20 ENCOUNTER — Other Ambulatory Visit: Payer: Self-pay

## 2020-11-20 ENCOUNTER — Ambulatory Visit: Payer: Self-pay | Admitting: Family Medicine

## 2020-11-20 ENCOUNTER — Encounter: Payer: Self-pay | Admitting: Family Medicine

## 2020-11-20 DIAGNOSIS — Z3169 Encounter for other general counseling and advice on procreation: Secondary | ICD-10-CM

## 2020-11-20 DIAGNOSIS — Z113 Encounter for screening for infections with a predominantly sexual mode of transmission: Secondary | ICD-10-CM

## 2020-11-20 LAB — WET PREP FOR TRICH, YEAST, CLUE
Clue Cell Exam: POSITIVE — AB
Trichomonas Exam: NEGATIVE
Yeast Exam: NEGATIVE

## 2020-11-20 NOTE — Progress Notes (Signed)
Wet mount reviewed with Provider.  No further orders given. Camaryn Lumbert M Jasiyah Paulding, RN  

## 2020-11-20 NOTE — Progress Notes (Signed)
[facility] Department STI clinic/screening visit  Subjective:  Laura Fleming is a 25 y.o. female being seen today for an STI screening visit. The patient reports they do not have symptoms.  Patient reports that they do desire a pregnancy in the next year.   They reported they are not interested in discussing contraception today.  No LMP recorded. Patient has had an implant.   Patient has the following medical conditions:   Patient Active Problem List   Diagnosis Date Noted  . Pyelonephritis 04/23/2015  . Sepsis (HCC) 04/23/2015    Chief Complaint  Patient presents with  . SEXUALLY TRANSMITTED DISEASE    screening    HPI  Patient reports here for screening, her and her partner desire a pregnancy.    Last HIV test per patient/review of record was 2019 Patient reports last pap was 2018  See flowsheet for further details and programmatic requirements.    The following portions of the patient's history were reviewed and updated as appropriate: allergies, current medications, past medical history, past social history, past surgical history and problem list.  Objective:  There were no vitals filed for this visit.  Physical Exam Vitals and nursing note reviewed.  Constitutional:      Appearance: Normal appearance.  HENT:     Head: Normocephalic and atraumatic.     Mouth/Throat:     Mouth: Mucous membranes are moist.     Pharynx: Oropharynx is clear. No oropharyngeal exudate or posterior oropharyngeal erythema.  Pulmonary:     Effort: Pulmonary effort is normal.  Chest:  Breasts:     Right: No axillary adenopathy or supraclavicular adenopathy.     Left: No axillary adenopathy or supraclavicular adenopathy.    Abdominal:     General: Abdomen is flat.     Palpations: There is no mass.     Tenderness: There is no abdominal tenderness. There is no rebound.  Genitourinary:    Exam position: Lithotomy position.     Pubic Area: No rash or pubic lice.       Labia:        Right: No rash or lesion.        Left: No rash or lesion.      Vagina: No erythema, bleeding or lesions.     Cervix: No cervical motion tenderness, discharge, friability, lesion or erythema.     Uterus: Normal.      Adnexa: Right adnexa normal and left adnexa normal.     Comments: Deferred, patient self collected  Lymphadenopathy:     Head:     Right side of head: No preauricular or posterior auricular adenopathy.     Left side of head: No preauricular or posterior auricular adenopathy.     Cervical: No cervical adenopathy.     Upper Body:     Right upper body: No supraclavicular or axillary adenopathy.     Left upper body: No supraclavicular or axillary adenopathy.     Lower Body: No right inguinal adenopathy. No left inguinal adenopathy.  Skin:    General: Skin is warm and dry.     Findings: No rash.  Neurological:     Mental Status: She is alert and oriented to person, place, and time.      Assessment and Plan:  Laura Fleming is a 25 y.o. female presenting to the [facility] Department for STI screening  1. Screening examination for venereal disease - Chlamydia/Gonorrhea [place] [facility] - WET PREP FOR TRICH,  YEAST, CLUE  Patient accepted all screenings including wet prep, vaginal CT/GC and declines bloodwork for HIV/RPR.  Patient meets criteria for HepB screening? Yes. Ordered? No - declined  Patient meets criteria for HepC screening? Yes. Ordered? No - declined   Wet prep results  Pos clue neg amine.  NoTreatment needed  Discussed time line for State Lab results and that patient will be called with positive results and encouraged patient to call if she had not heard in 2 weeks.  Counseled to return or seek care for continued or worsening symptoms Recommended condom use with all sex   2. Encounter for preconception consultation  Discussed with patient about partner screening for STI's and for patient to start taking prenatal vitamins.      Patient is currently using nexplanon  to prevent pregnancy. Patient to remove Nexplanon for desired pregnancy.       Return for as needed.  Future Appointments  Date Time Provider Department Center  11/24/2020  3:40 PM [facility] PROVIDER [facility] None    Wendi Snipes, FNP

## 2020-11-24 ENCOUNTER — Ambulatory Visit: Payer: Self-pay

## 2020-11-26 ENCOUNTER — Telehealth: Payer: Self-pay | Admitting: Family Medicine

## 2020-11-26 NOTE — Telephone Encounter (Signed)
Patient called because she missed her 11/24/20 appointment and wanted to reschedule. Was upset that I couldn't reschedule it for her when I let her know that I had to leave a note for the provider to call her back to reschedule.

## 2020-11-26 NOTE — Telephone Encounter (Signed)
Phone call to pt. Left message on voicemail that RN with [facility] is returning phone call. If still need assistance, please call us back at [phone].

## 2020-11-27 ENCOUNTER — Ambulatory Visit (LOCAL_COMMUNITY_HEALTH_CENTER): Payer: Self-pay | Admitting: Advanced Practice Midwife

## 2020-11-27 ENCOUNTER — Other Ambulatory Visit: Payer: Self-pay

## 2020-11-27 ENCOUNTER — Ambulatory Visit: Payer: Self-pay

## 2020-11-27 VITALS — BP 125/79

## 2020-11-27 DIAGNOSIS — E663 Overweight: Secondary | ICD-10-CM | POA: Insufficient documentation

## 2020-11-27 DIAGNOSIS — Z3009 Encounter for other general counseling and advice on contraception: Secondary | ICD-10-CM

## 2020-11-27 DIAGNOSIS — Z3046 Encounter for surveillance of implantable subdermal contraceptive: Secondary | ICD-10-CM

## 2020-11-27 DIAGNOSIS — R030 Elevated blood-pressure reading, without diagnosis of hypertension: Secondary | ICD-10-CM | POA: Insufficient documentation

## 2020-11-27 NOTE — Progress Notes (Signed)
Contraception/Family Planning VISIT ENCOUNTER NOTE  Subjective:   Laura Fleming is a 25 y.o.SBF nullip  G0P0000 female here for reproductive life counseling.  Desires Nexplanon removal in order to conceive.   Reports she does want a pregnancy in the next year. Denies abnormal vaginal bleeding, discharge, pelvic pain, problems with intercourse or other gynecologic concerns. Wants Nexplanon removal today to conceive. LMP none. Last sex 11/13/20 with female partner x 2 years; 1 partner in last 3 mo. Last pap?2018?Marland Kitchen Nexplanon inserted 03/14/20 and BP was 146/90 at that time.  Employed 18 hrs/wk and living with parents and her sister.   Gynecologic History No LMP recorded. Patient has had an implant. Contraception: Nexplanon  Health Maintenance Due  Topic Date Due  . Hepatitis C Screening  Never done  . COVID-19 Vaccine (1) Never done  . TETANUS/TDAP  03/26/2017  . PAP-Cervical Cytology Screening  Never done  . PAP SMEAR-Modifier  Never done     The following portions of the patient's history were reviewed and updated as appropriate: allergies, current medications, past family history, past medical history, past social history, past surgical history and problem list.  Review of Systems Pertinent items are noted in HPI.   Objective:  BP 125/79  Gen: well appearing, NAD HEENT: no scleral icterus CV: RR Lung: Normal WOB Ext: warm well perfused  PELVIC: Normal appearing external genitalia; normal appearing vaginal mucosa and cervix.  No abnormal discharge noted.  Pap smear obtained.  Normal uterine size, no other palpable masses, no uterine or adnexal tenderness.   Assessment and Plan:   Contraception counseling: Reviewed all forms of birth control options in the tiered based approach. available including abstinence; over the counter/barrier methods; hormonal contraceptive medication including pill, patch, ring, injection,contraceptive implant, ECP; hormonal and nonhormonal IUDs;  permanent sterilization options including vasectomy and the various tubal sterilization modalities. Risks, benefits, and typical effectiveness rates were reviewed.  Questions were answered.  Written information was also given to the patient to review.  Patient desires nothing, this was prescribed for patient. She will follow up in  prn for surveillance.  She was told to call with any further questions, or with any concerns about this method of contraception.  Emphasized use of condoms 100% of the time for STI prevention.  Patient was offered ECP. ECP was not accepted by the patient. ECP counseling was not given - see RN documentation  1. Family planning Pap done.  Needs physical - Pap IG (Image Guided)  2. Encounter for surveillance of implantable subdermal contraceptive Nexplanon Removal Patient identified, informed consent performed, consent signed.   Appropriate time out taken. Nexplanon site identified.  Area prepped in usual sterile fashon. 3 ml of 1% lidocaine with Epinephrine was used to anesthetize the area at the distal end of the implant and along implant site. A small stab incision was made right beside the implant on the distal portion.  The Nexplanon rod was grasped using straight hemostats/manual and removed without difficulty.  There was minimal blood loss. There were no complications.  Steri-strips were applied over the small incision.  A pressure bandage was applied to reduce any bruising.  The patient tolerated the procedure well and was given post procedure instructions.   Nexplanon:   Counseled patient to take OTC analgesic starting as soon as lidocaine starts to wear off and take regularly for at least 48 hr to decrease discomfort.  Specifically to take with food or milk to decrease stomach upset and for IB 600 mg (  3 tablets) every 6 hrs; IB 800 mg (4 tablets) every 8 hrs; or Aleve 2 tablets every 12 hrs.    3. Overweight 190 lbs     Please refer to After Visit Summary for  other counseling recommendations.   No follow-ups on file.  Alberteen Spindle, CNM [facility] DEPARTMENT

## 2020-12-02 LAB — PAP IG (IMAGE GUIDED): PAP Smear Comment: 0

## 2021-06-19 ENCOUNTER — Encounter: Payer: Self-pay | Admitting: Intensive Care

## 2021-06-19 ENCOUNTER — Emergency Department
Admission: EM | Admit: 2021-06-19 | Discharge: 2021-06-19 | Disposition: A | Discharge status: Home / Self Care | Payer: Self-pay | Attending: Emergency Medicine | Admitting: Emergency Medicine

## 2021-06-19 ENCOUNTER — Other Ambulatory Visit: Payer: Self-pay

## 2021-06-19 DIAGNOSIS — G43819 Other migraine, intractable, without status migrainosus: Secondary | ICD-10-CM | POA: Insufficient documentation

## 2021-06-19 DIAGNOSIS — J45909 Unspecified asthma, uncomplicated: Secondary | ICD-10-CM | POA: Insufficient documentation

## 2021-06-19 LAB — POC URINE PREG, ED: Preg Test, Ur: NEGATIVE

## 2021-06-19 MED ORDER — DIPHENHYDRAMINE HCL 50 MG/ML IJ SOLN
12.5000 mg | Freq: Once | INTRAMUSCULAR | Status: AC
  Administered 2021-06-19: 12.5 mg via INTRAVENOUS
  Filled 2021-06-19: qty 1

## 2021-06-19 MED ORDER — PROCHLORPERAZINE EDISYLATE 10 MG/2ML IJ SOLN
10.0000 mg | Freq: Once | INTRAMUSCULAR | Status: AC
  Administered 2021-06-19: 10 mg via INTRAVENOUS
  Filled 2021-06-19: qty 2

## 2021-06-19 MED ORDER — KETOROLAC TROMETHAMINE 30 MG/ML IJ SOLN
15.0000 mg | Freq: Once | INTRAMUSCULAR | Status: AC
  Administered 2021-06-19: 15 mg via INTRAVENOUS
  Filled 2021-06-19: qty 1

## 2021-06-19 NOTE — ED Provider Notes (Signed)
Emergency Medicine Provider Triage Evaluation Note  Laura Fleming , a 25 y.o. female  was evaluated in triage.  Pt complains of headache x 1 month. Tylenol/ibuprofen at home not helping. Sharp pain, 10/10, localized to back and temporal regions, comes and go intermittently. Took tylenol an hour ago. Sees lights/floaters in her vision. Has not previously been seen by a provider for this issue; no prior diagnoses.   Review of Systems  Positive: Nausea Negative: Neck pain/stiffness, chest pain, SOB, fever/chills, vision loss  Physical Exam  There were no vitals taken for this visit. Gen:   Awake, no distress   Resp:  Normal effort  MSK:   Moves extremities without difficulty  Other:   None  Medical Decision Making  Medically screening exam initiated at 3:14 PM.  Appropriate orders placed.  Laura Fleming was informed that the remainder of the evaluation will be completed by another provider, this initial triage assessment does not replace that evaluation, and the importance of remaining in the ED until their evaluation is complete.      Varney Daily, [place] 06/19/21 1522    Merwyn Katos, MD 06/19/21 [phone]

## 2021-06-19 NOTE — Discharge Instructions (Addendum)
Take Tylenol 1 g every 8 hours and ibuprofen 600 every 6-8 hours with food to help with headaches.  Call the neurology number to get follow-up outpatient return to ER if headaches are getting worse or any other concerns

## 2021-06-19 NOTE — ED Triage Notes (Signed)
Patient c/o intermittent migraines the last month. No relief with tylenol or ibuprofen. Light sensitivity. C/o nausea.

## 2021-06-19 NOTE — ED Notes (Signed)
Follow uyp neurology info provided

## 2021-06-19 NOTE — ED Provider Notes (Signed)
[facility] Emergency Department Provider Note  ____________________________________________   Event Date/Time   First MD Initiated Contact with Patient 06/19/21 1646     (approximate)  I have reviewed the triage vital signs and the nursing notes.   HISTORY  Chief Complaint Migraine    HPI BRITINI GARCILAZO is a 25 y.o. female with history of asthma who comes in with concern for headaches.  Patient reports having intermittent headaches over the last month.  Unclear what brings it on but does report sensitivity to light.  Reports is not getting better with Tylenol or ibuprofen.  Currently the headache is on her bilateral temples and wraps behind her head. They gradually get worse, not sudden or severe in onset.  Denies hitting her head, denies blurry vision, denies headaches waking her up from sleep or associated vomiting.  No fevers.  No stiff neck.  No tick bites.  Denies a history of headaches.  On review of records patient had a CT scan of her head in 2018 that was negative          Past Medical History:  Diagnosis Date   Acne    Allergy    Anemia    Asthma    Mongolian macula     Patient Active Problem List   Diagnosis Date Noted   Overweight 190 lbs 11/27/2020   Elevated blood pressure reading 03/14/20 146/90 11/27/2020   Pyelonephritis 04/23/2015   Sepsis (HCC) 04/23/2015    Past Surgical History:  Procedure Laterality Date   TYMPANOSTOMY TUBE PLACEMENT      Prior to Admission medications   Medication Sig Start Date End Date Taking? Authorizing Provider  albuterol (PROVENTIL HFA;VENTOLIN HFA) 108 (90 Base) MCG/ACT inhaler Inhale 2 puffs into the lungs every 4 (four) hours as needed for wheezing or shortness of breath. 05/03/16   Cuthriell, Delorise Royals, PA-C  dicyclomine (BENTYL) 10 MG capsule Take 1 capsule (10 mg total) by mouth 4 (four) times daily for 14 days. 02/08/20 02/22/20  Fisher, Roselyn Bering, PA-C  etonogestrel (NEXPLANON) 68 MG IMPL  implant 1 each by Subdermal route once. 11/18/16   Tessa Lerner, NP  ibuprofen (ADVIL,MOTRIN) 200 MG tablet Take 400 mg by mouth every 6 (six) hours as needed for headache. Patient not taking: No sig reported    [provider]  sulfamethoxazole-trimethoprim (BACTRIM DS) 800-160 MG tablet Take 1 tablet by mouth 2 (two) times daily. Patient not taking: No sig reported 09/29/19   Enid Derry, PA-C  traMADol (ULTRAM) 50 MG tablet Take 1 tablet (50 mg total) by mouth every 6 (six) hours as needed. Patient not taking: No sig reported 01/09/17   Minna Antis, MD    Allergies Penicillins  Family History  Problem Relation Age of Onset   Asthma Sister     Social History Social History   Tobacco Use   Smoking status: Never   Smokeless tobacco: Never  Vaping Use   Vaping Use: Never used  Substance Use Topics   Alcohol use: Yes    Comment: socially    Drug use: Yes    Types: Marijuana    Comment: only for special occasions       Review of Systems Constitutional: No fever/chills Eyes: No visual changes. ENT: No sore throat. Cardiovascular: Denies chest pain. Respiratory: Denies shortness of breath. Gastrointestinal: No abdominal pain.  No nausea, no vomiting.  No diarrhea.  No constipation. Genitourinary: Negative for dysuria. Musculoskeletal: Negative for back pain. Skin: Negative  for rash. Neurological: Positive headache, no focal weakness or numbness. All other ROS negative ____________________________________________   PHYSICAL EXAM:  VITAL SIGNS: ED Triage Vitals  Enc Vitals Group     BP 06/19/21 1516 (!) 141/88     Pulse Rate 06/19/21 1516 86     Resp 06/19/21 1516 16     Temp 06/19/21 1516 98.8 F (37.1 C)     Temp Source 06/19/21 1516 Oral     SpO2 06/19/21 1516 99 %     Weight 06/19/21 1520 180 lb (81.6 kg)     Height 06/19/21 1520 5\' 6"  (1.676 m)     Head Circumference --      Peak Flow --      Pain Score 06/19/21 1520 10     Pain  Loc --      Pain Edu? --      Excl. in [place]? --     Constitutional: Alert and oriented. Well appearing and in no acute distress. Eyes: Conjunctivae are normal. EOMI. pupils reactive bilaterally Head: Atraumatic. Nose: No congestion/rhinnorhea. Mouth/Throat: Mucous membranes are moist.   Neck: No stridor. Trachea Midline. FROM.  No stiff neck Cardiovascular: Normal rate, regular rhythm. Grossly normal heart sounds.  Good peripheral circulation. Respiratory: Normal respiratory effort.  No retractions. Lungs CTAB. Gastrointestinal: Soft and nontender. No distention. No abdominal bruits.  Musculoskeletal: No lower extremity tenderness nor edema.  No joint effusions. Neurologic:  Normal speech and language. No gross focal neurologic deficits are appreciated.  Cranial nerves are intact.  Equal strength in arms and legs. Skin:  Skin is warm, dry and intact. No rash noted. Psychiatric: Mood and affect are normal. Speech and behavior are normal. GU: Deferred   ____________________________________________   LABS (all labs ordered are listed, but only abnormal results are displayed)  Labs Reviewed  POC URINE PREG, ED   ____________________________________________   INITIAL IMPRESSION / ASSESSMENT AND PLAN / ED COURSE  06/21/21 Mcauliff was evaluated in Emergency Department on 06/19/2021 for the symptoms described in the history of present illness. She was evaluated in the context of the global COVID-19 pandemic, which necessitated consideration that the patient might be at risk for infection with the SARS-CoV-2 virus that causes COVID-19. Institutional protocols and algorithms that pertain to the evaluation of patients at risk for COVID-19 are in a state of rapid change based on information released by regulatory bodies including the CDC and federal and state organizations. These policies and algorithms were followed during the patient's care in the ED.    Patient comes in with headaches.   Suspect most likely migraines.  We discussed CT imaging but patient has no red flag symptoms and has had a CT scan few years ago that was negative for any mass.  I am very low suspicion for subarachnoid and patient is very well-appearing given not sudden or severe onset.  She would like to hold off on CT scan.  We will start off with migraine cocktail.  We will get pregnancy test today patient states she is sexually active with women only.  Therefore low suspicion for preeclampsia.  No tick bites to suggest [facility] spotted fever.  No fevers and supple neck so therefore low suspicion for meningitis  On reassessment after migraine cocktail patient feeling much better and states that she feels like she can go home.  She is going to continue Tylenol and ibuprofen will write down the proper dosages for her.  We will give her neurology number to  follow-up with.  I discussed the provisional nature of ED diagnosis, the treatment so far, the ongoing plan of care, follow up appointments and return precautions with the patient and any family or support people present. They expressed understanding and agreed with the plan, discharged home.         ____________________________________________   FINAL CLINICAL IMPRESSION(S) / ED DIAGNOSES   Final diagnoses:  Other migraine without status migrainosus, intractable      MEDICATIONS GIVEN DURING THIS VISIT:  Medications  prochlorperazine (COMPAZINE) injection 10 mg (10 mg Intravenous Given 06/19/21 1726)  diphenhydrAMINE (BENADRYL) injection 12.5 mg (12.5 mg Intravenous Given 06/19/21 1727)  ketorolac (TORADOL) 30 MG/ML injection 15 mg (15 mg Intravenous Given 06/19/21 1726)     ED Discharge Orders     None        Note:  This document was prepared using Dragon voice recognition software and may include unintentional dictation errors.    Concha Se, MD 06/19/21 [phone]

## 2022-01-22 ENCOUNTER — Encounter: Payer: Self-pay | Admitting: Emergency Medicine

## 2022-01-22 ENCOUNTER — Emergency Department
Admission: EM | Admit: 2022-01-22 | Discharge: 2022-01-22 | Disposition: A | Discharge status: Home / Self Care | Payer: Self-pay | Attending: Student in an Organized Health Care Education/Training Program | Admitting: Student in an Organized Health Care Education/Training Program

## 2022-01-22 ENCOUNTER — Other Ambulatory Visit: Payer: Self-pay

## 2022-01-22 DIAGNOSIS — R519 Headache, unspecified: Secondary | ICD-10-CM | POA: Insufficient documentation

## 2022-01-22 LAB — POC URINE PREG, ED: Preg Test, Ur: NEGATIVE

## 2022-01-22 MED ORDER — DIPHENHYDRAMINE HCL 50 MG/ML IJ SOLN
12.5000 mg | Freq: Once | INTRAMUSCULAR | Status: AC
  Administered 2022-01-22: 12.5 mg via INTRAVENOUS
  Filled 2022-01-22: qty 1

## 2022-01-22 MED ORDER — KETOROLAC TROMETHAMINE 30 MG/ML IJ SOLN
15.0000 mg | Freq: Once | INTRAMUSCULAR | Status: AC
  Administered 2022-01-22: 15 mg via INTRAVENOUS
  Filled 2022-01-22: qty 1

## 2022-01-22 MED ORDER — PROCHLORPERAZINE EDISYLATE 10 MG/2ML IJ SOLN
10.0000 mg | Freq: Once | INTRAMUSCULAR | Status: AC
  Administered 2022-01-22: 10 mg via INTRAVENOUS
  Filled 2022-01-22: qty 2

## 2022-01-22 MED ORDER — DEXAMETHASONE SODIUM PHOSPHATE 10 MG/ML IJ SOLN
10.0000 mg | Freq: Once | INTRAMUSCULAR | Status: AC
  Administered 2022-01-22: 10 mg via INTRAVENOUS
  Filled 2022-01-22: qty 1

## 2022-01-22 NOTE — ED Notes (Signed)
Complains of headache. Rates pain at 8. Having light sensitivity. Alert and oriented. IV started without difficulty in RAC.

## 2022-08-18 ENCOUNTER — Emergency Department
Admission: EM | Admit: 2022-08-18 | Discharge: 2022-08-18 | Disposition: A | Discharge status: Home / Self Care | Payer: Self-pay | Attending: Emergency Medicine | Admitting: Emergency Medicine

## 2022-08-18 ENCOUNTER — Emergency Department: Payer: Self-pay

## 2022-08-18 ENCOUNTER — Other Ambulatory Visit: Payer: Self-pay

## 2022-08-18 DIAGNOSIS — U071 COVID-19: Secondary | ICD-10-CM | POA: Insufficient documentation

## 2022-08-18 LAB — RESP PANEL BY RT-PCR (RSV, FLU A&B, COVID)  RVPGX2
Influenza A by PCR: NEGATIVE
Influenza B by PCR: NEGATIVE
Resp Syncytial Virus by PCR: NEGATIVE
SARS Coronavirus 2 by RT PCR: POSITIVE — AB

## 2022-08-18 MED ORDER — BENZONATATE 200 MG PO CAPS: 200.0000 mg | ORAL_CAPSULE | Freq: Three times a day (TID) | ORAL | 0 refills | Status: AC | PRN

## 2022-08-18 NOTE — Discharge Instructions (Signed)
Follow-up your primary care provider if any continued a prescription or you may follow-up with one of the urgent cares in [place].  A prescription for Tessalon Perles was sent to the pharmacy to take every 8 hours if needed for cough.  You may continue Tylenol or ibuprofen as needed for body aches, headache or fever.  Increase fluids to stay hydrated.  Also take deep breaths and walk inside the house to help prevent pneumonia.  Return to the emergency department if you develop any shortness of breath or difficulty breathing.  At this time it is a good idea to discontinue smoking.

## 2022-08-18 NOTE — ED Triage Notes (Signed)
Pt to ED via POV from home. Pt ambulatory to triage. Pt reports cough, night sweats and runny nose since Friday. Pt reports possible Flu exposure.

## 2022-08-18 NOTE — ED Provider Notes (Signed)
[facility] Provider Note    Event Date/Time   First MD Initiated Contact with Patient 08/18/22 [phone]     (approximate)   History   Cough   HPI  Laura Fleming is a 27 y.o. female   to ED with complaint of cough, rhinorrhea and bodyaches for the last 5 days.  Patient also states that she is having night sweats but has not actually taken her temperature.  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  Patient states that she also was sick during Christmas with similar symptoms but was not tested.  Denies history of pyelonephritis and elevated blood pressure.  She also admits to smoking daily "but not cigarettes".      Physical Exam   Triage Vital Signs: ED Triage Vitals  Enc Vitals Group     BP 08/18/22 0830 (!) 150/90     Pulse Rate 08/18/22 0830 65     Resp 08/18/22 0830 18     Temp 08/18/22 0830 98 F (36.7 C)     Temp Source 08/18/22 0830 Oral     SpO2 08/18/22 0830 99 %     Weight --      Height --      Head Circumference --      Peak Flow --      Pain Score 08/18/22 0828 0     Pain Loc --      Pain Edu? --      Excl. in [place]? --     Most recent vital signs: Vitals:   08/18/22 0830  BP: (!) 150/90  Pulse: 65  Resp: 18  Temp: 98 F (36.7 C)  SpO2: 99%     General: Awake, no distress.  CV:  Good peripheral perfusion.  Heart regular rate and rhythm. Resp:  Normal effort.  Lungs are clear bilaterally.  No difficulty breathing and patient is able to talk in complete sentences without noted shortness of breath. Abd:  No distention.  Other:     ED Results / Procedures / Treatments   Labs (all labs ordered are listed, but only abnormal results are displayed) Labs Reviewed  RESP PANEL BY RT-PCR (RSV, FLU A&B, COVID)  RVPGX2 - Abnormal; Notable for the following components:      Result Value   SARS Coronavirus 2 by RT PCR POSITIVE (*)    All other components within normal limits      RADIOLOGY Chest x-ray images  reviewed by myself independent of the radiologist and no infiltrate was noted.  Official radiology report is negative.    PROCEDURES:  Critical Care performed:   Procedures   MEDICATIONS ORDERED IN ED: Medications - No data to display   IMPRESSION / MDM / [place] / ED COURSE  I reviewed the triage vital signs and the nursing notes.   Differential diagnosis includes, but is not limited to, viral upper respiratory infection, COVID, influenza, RSV, pneumonia.  27 year old female presents to the ED with complaint of cough, congestion, rhinorrhea that began 5 days ago.  Patient has had the COVID vaccine.  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  Respiratory panel was positive for COVID and patient was made aware.  Chest x-ray was reassuring and patient is happy that she does not also have pneumonia as she was sick in December as well.  A prescription for Ladona Ridgel was sent to the pharmacy.  She is encouraged to  continue with Tylenol, ibuprofen, fluids and taking deep breaths.  She is aware that should she develop any shortness of breath or difficulty breathing she is to return to the emergency department immediately.      Patient's presentation is most consistent with acute complicated illness / injury requiring diagnostic workup.  FINAL CLINICAL IMPRESSION(S) / ED DIAGNOSES   Final diagnoses:  COVID     Rx / DC Orders   ED Discharge Orders          Ordered    benzonatate (TESSALON) 200 MG capsule  Every 8 hours PRN        08/18/22 1021             Note:  This document was prepared using Dragon voice recognition software and may include unintentional dictation errors.   Johnn Hai, PA-C 08/18/22 1027    Blake Divine, MD 08/18/22 [phone]

## 2022-09-28 ENCOUNTER — Emergency Department
Admission: EM | Admit: 2022-09-28 | Discharge: 2022-09-28 | Disposition: A | Discharge status: Home / Self Care | Payer: Self-pay | Attending: Emergency Medicine | Admitting: Emergency Medicine

## 2022-09-28 ENCOUNTER — Encounter: Payer: Self-pay | Admitting: Emergency Medicine

## 2022-09-28 ENCOUNTER — Other Ambulatory Visit: Payer: Self-pay

## 2022-09-28 ENCOUNTER — Emergency Department: Payer: Self-pay

## 2022-09-28 DIAGNOSIS — J40 Bronchitis, not specified as acute or chronic: Secondary | ICD-10-CM | POA: Insufficient documentation

## 2022-09-28 DIAGNOSIS — Z7951 Long term (current) use of inhaled steroids: Secondary | ICD-10-CM | POA: Insufficient documentation

## 2022-09-28 DIAGNOSIS — J45909 Unspecified asthma, uncomplicated: Secondary | ICD-10-CM | POA: Insufficient documentation

## 2022-09-28 MED ORDER — IPRATROPIUM-ALBUTEROL 0.5-2.5 (3) MG/3ML IN SOLN
3.0000 mL | Freq: Once | RESPIRATORY_TRACT | Status: AC
  Administered 2022-09-28: 3 mL via RESPIRATORY_TRACT
  Filled 2022-09-28: qty 3

## 2022-09-28 MED ORDER — PREDNISONE 50 MG PO TABS: 50.0000 mg | ORAL_TABLET | Freq: Every day | ORAL | 0 refills | Status: AC

## 2022-09-28 MED ORDER — AZITHROMYCIN 250 MG PO TABS: ORAL_TABLET | ORAL | 0 refills | Status: AC

## 2022-09-28 NOTE — ED Provider Notes (Signed)
[facility] Provider Note    Event Date/Time   First MD Initiated Contact with Patient 09/28/22 [phone]     (approximate)   History   Shortness of Breath   HPI  Laura Fleming is a 27 y.o. female with a history of asthma presents with complaints of cough.  Patient reports she was diagnosed with COVID in mid January, she is having continued cough with sputum and some shortness of breath.  No chest pain or pleurisy noted.  No fevers reported   Physical Exam   Triage Vital Signs: ED Triage Vitals  Enc Vitals Group     BP 09/28/22 0650 133/86     Pulse Rate 09/28/22 0650 74     Resp 09/28/22 0650 18     Temp 09/28/22 0650 98.5 F (36.9 C)     Temp Source 09/28/22 0650 Oral     SpO2 09/28/22 0650 100 %     Weight 09/28/22 0651 86.2 kg (190 lb)     Height 09/28/22 0651 1.626 m ('5\' 4"'$ )     Head Circumference --      Peak Flow --      Pain Score 09/28/22 0651 0     Pain Loc --      Pain Edu? --      Excl. in [place]? --     Most recent vital signs: Vitals:   09/28/22 0650  BP: 133/86  Pulse: 74  Resp: 18  Temp: 98.5 F (36.9 C)  SpO2: 100%     General: Awake, no distress.  CV:  Good peripheral perfusion.  Resp:  Normal effort.  Scattered mild wheezes Abd:  No distention.  Other:     ED Results / Procedures / Treatments   Labs (all labs ordered are listed, but only abnormal results are displayed) Labs Reviewed - No data to display   EKG  ED ECG REPORT I, Lavonia Drafts, the attending physician, personally viewed and interpreted this ECG.  Date: 09/28/2022  Rhythm: normal sinus rhythm QRS Axis: normal Intervals: normal ST/T Wave abnormalities: normal Narrative Interpretation: no evidence of acute ischemia    RADIOLOGY Chest x-ray viewed interpret by me, no acute abnormality    PROCEDURES:  Critical Care performed:   Procedures   MEDICATIONS ORDERED IN ED: Medications  ipratropium-albuterol (DUONEB) 0.5-2.5 (3)  MG/3ML nebulizer solution 3 mL (3 mLs Nebulization Given 09/28/22 0753)     IMPRESSION / MDM / ASSESSMENT AND PLAN / ED COURSE  I reviewed the triage vital signs and the nursing notes. Patient's presentation is most consistent with acute presentation with potential threat to life or bodily function.  Patient presents with cough and shortness of breath as noted above.  Differential includes pneumonia, postviral bronchitis, bronchospasm, not consistent with PE patient is low risk.  Patient treated with DuoNeb in the emergency department, chest x-ray is reassuring, most consistent with bronchitis, will treat with prednisone burst, outpatient follow-up recommended       FINAL CLINICAL IMPRESSION(S) / ED DIAGNOSES   Final diagnoses:  Bronchitis     Rx / DC Orders   ED Discharge Orders          Ordered    predniSONE (DELTASONE) 50 MG tablet  Daily with breakfast        09/28/22 0753    azithromycin (ZITHROMAX Z-PAK) 250 MG tablet        09/28/22 0753             Note:  This document was prepared using Dragon voice recognition software and may include unintentional dictation errors.   Lavonia Drafts, MD 09/28/22 [phone]

## 2022-09-28 NOTE — ED Notes (Signed)
See triage note  Presents with cough and "I just don't feel well"  Afebrile on arrival   States cough is productive   greenish phlegm

## 2022-09-28 NOTE — ED Triage Notes (Addendum)
Patient ambulatory to triage with steady gait, without difficulty or distress noted; pt reports that she tested +COVID 2wks ago; c/o persistent prod cough green sputum with SHOB, noted bloody sputum this morning, esp on exertion
